# Patient Record
Sex: Male | Born: 1955 | Race: White | Hispanic: No | Marital: Married | Smoking: Former smoker
Health system: Southern US, Community
[De-identification: ages and names within clinical notes are randomized; demographics above are authoritative.]

## PROBLEM LIST (undated history)

## (undated) DIAGNOSIS — J449 Chronic obstructive pulmonary disease, unspecified: Secondary | ICD-10-CM

## (undated) DIAGNOSIS — J45909 Unspecified asthma, uncomplicated: Secondary | ICD-10-CM

## (undated) DIAGNOSIS — K219 Gastro-esophageal reflux disease without esophagitis: Secondary | ICD-10-CM

## (undated) HISTORY — DX: Chronic obstructive pulmonary disease, unspecified: J44.9

## (undated) HISTORY — DX: Unspecified asthma, uncomplicated: J45.909

## (undated) HISTORY — PX: SPINE SURGERY: SHX786

## (undated) HISTORY — DX: Gastro-esophageal reflux disease without esophagitis: K21.9

---

## 1997-04-09 HISTORY — PX: CHOLECYSTECTOMY: SHX55

## 2003-07-01 ENCOUNTER — Ambulatory Visit (HOSPITAL_COMMUNITY): Admission: RE | Admit: 2003-07-01 | Discharge: 2003-07-01 | Payer: Self-pay | Admitting: Family Medicine

## 2003-07-05 ENCOUNTER — Ambulatory Visit (HOSPITAL_COMMUNITY): Admission: RE | Admit: 2003-07-05 | Discharge: 2003-07-05 | Payer: Self-pay | Admitting: Family Medicine

## 2003-07-21 ENCOUNTER — Encounter (HOSPITAL_COMMUNITY): Admission: RE | Admit: 2003-07-21 | Discharge: 2003-08-20 | Payer: Self-pay | Admitting: Family Medicine

## 2005-07-28 ENCOUNTER — Emergency Department (HOSPITAL_COMMUNITY): Admission: EM | Admit: 2005-07-28 | Discharge: 2005-07-29 | Payer: Self-pay | Admitting: Emergency Medicine

## 2005-07-30 ENCOUNTER — Ambulatory Visit (HOSPITAL_COMMUNITY): Admission: RE | Admit: 2005-07-30 | Discharge: 2005-07-30 | Payer: Self-pay | Admitting: Family Medicine

## 2006-12-27 ENCOUNTER — Ambulatory Visit (HOSPITAL_COMMUNITY): Admission: RE | Admit: 2006-12-27 | Discharge: 2006-12-27 | Payer: Self-pay | Admitting: Family Medicine

## 2007-02-06 ENCOUNTER — Encounter (HOSPITAL_COMMUNITY): Admission: RE | Admit: 2007-02-06 | Discharge: 2007-03-08 | Payer: Self-pay | Admitting: Specialist

## 2007-03-11 ENCOUNTER — Encounter (HOSPITAL_COMMUNITY): Admission: RE | Admit: 2007-03-11 | Discharge: 2007-04-09 | Payer: Self-pay | Admitting: Specialist

## 2007-05-27 ENCOUNTER — Inpatient Hospital Stay (HOSPITAL_COMMUNITY): Admission: RE | Admit: 2007-05-27 | Discharge: 2007-05-30 | Payer: Self-pay | Admitting: Specialist

## 2007-08-12 ENCOUNTER — Encounter (HOSPITAL_COMMUNITY): Admission: RE | Admit: 2007-08-12 | Discharge: 2007-09-11 | Payer: Self-pay | Admitting: Specialist

## 2007-09-16 ENCOUNTER — Encounter (HOSPITAL_COMMUNITY): Admission: RE | Admit: 2007-09-16 | Discharge: 2007-10-16 | Payer: Self-pay | Admitting: Specialist

## 2009-04-06 ENCOUNTER — Encounter
Admission: RE | Admit: 2009-04-06 | Discharge: 2009-04-07 | Payer: Self-pay | Admitting: Physical Medicine & Rehabilitation

## 2009-04-07 ENCOUNTER — Ambulatory Visit: Payer: Self-pay | Admitting: Physical Medicine & Rehabilitation

## 2009-05-12 ENCOUNTER — Encounter
Admission: RE | Admit: 2009-05-12 | Discharge: 2009-08-10 | Payer: Self-pay | Admitting: Physical Medicine & Rehabilitation

## 2009-05-19 ENCOUNTER — Ambulatory Visit: Payer: Self-pay | Admitting: Physical Medicine & Rehabilitation

## 2009-06-16 ENCOUNTER — Ambulatory Visit: Payer: Self-pay | Admitting: Physical Medicine & Rehabilitation

## 2009-09-01 ENCOUNTER — Encounter
Admission: RE | Admit: 2009-09-01 | Discharge: 2009-09-01 | Payer: Self-pay | Admitting: Physical Medicine & Rehabilitation

## 2009-09-08 ENCOUNTER — Ambulatory Visit: Payer: Self-pay | Admitting: Physical Medicine & Rehabilitation

## 2009-11-23 ENCOUNTER — Encounter
Admission: RE | Admit: 2009-11-23 | Discharge: 2010-02-21 | Payer: Self-pay | Source: Home / Self Care | Admitting: Physical Medicine & Rehabilitation

## 2009-12-06 ENCOUNTER — Ambulatory Visit: Payer: Self-pay | Admitting: Physical Medicine & Rehabilitation

## 2010-01-16 ENCOUNTER — Ambulatory Visit: Payer: Self-pay | Admitting: Physical Medicine & Rehabilitation

## 2010-04-10 ENCOUNTER — Encounter
Admission: RE | Admit: 2010-04-10 | Discharge: 2010-04-11 | Payer: Self-pay | Source: Home / Self Care | Attending: Physical Medicine & Rehabilitation | Admitting: Physical Medicine & Rehabilitation

## 2010-04-11 ENCOUNTER — Ambulatory Visit
Admission: RE | Admit: 2010-04-11 | Discharge: 2010-04-11 | Payer: Self-pay | Source: Home / Self Care | Attending: Physical Medicine & Rehabilitation | Admitting: Physical Medicine & Rehabilitation

## 2010-07-11 ENCOUNTER — Ambulatory Visit: Payer: Self-pay | Admitting: Physical Medicine & Rehabilitation

## 2010-08-08 ENCOUNTER — Encounter: Payer: Private Health Insurance - Indemnity | Attending: Physical Medicine & Rehabilitation

## 2010-08-08 ENCOUNTER — Ambulatory Visit (HOSPITAL_BASED_OUTPATIENT_CLINIC_OR_DEPARTMENT_OTHER): Payer: Private Health Insurance - Indemnity | Admitting: Physical Medicine & Rehabilitation

## 2010-08-08 DIAGNOSIS — G5780 Other specified mononeuropathies of unspecified lower limb: Secondary | ICD-10-CM | POA: Insufficient documentation

## 2010-08-08 DIAGNOSIS — M79609 Pain in unspecified limb: Secondary | ICD-10-CM | POA: Insufficient documentation

## 2010-08-08 DIAGNOSIS — G5720 Lesion of femoral nerve, unspecified lower limb: Secondary | ICD-10-CM

## 2010-08-08 NOTE — Assessment & Plan Note (Signed)
REASON FOR VISIT:  Increased right lower extremity pain.  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  He has responded well to saphenous nerve blocks, lasting on average about 6 months.  He is about 6 months post most recent and it started wearing off about a month ago.  He also notes that he has had increased work hours.  He has had no new medical problems in the interval time.  MEDICATIONS:  Hydrocodone 5/325 one p.o. b.i.d.  He also feels like the pain is extended above his knee.  Health and history form reviewed.  The patient describes his pain as sharp, burning, stabbing, tingling, averaging 9/10, interfering with sleep.  Walking tolerance 15 minutes.  He climbs steps.  He can drive, works 40 plus hours a week, needs assist with household duties, otherwise independent.  Review of systems also positive for tingling, otherwise negative.  PHYSICAL EXAMINATION:  VITAL SIGNS:  Blood pressure 110/66, pulse 74, respirations 18, O2 sat 94% on room air. GENERAL:  No acute distress.  Mood and affect appropriate. MUSCULOSKELETAL:  Medial leg has some hypersensitivity to touch from the medial knee just above the femoral condyle to the lateral medial malleolus.  No erythema.  No swelling.  Foot is warm.  No skin discoloration.  IMPRESSION:  Saphenous neuropathy nerve block has been wearing off.  He has had some proximal extension of symptoms.  PLAN:  We will put him on a Medrol Dosepak, continue the hydrocodone given scheduled for repeat block.  We will go below the knee, given that this has been helpful and this is the main location of his pain, failing this may need to do an ultrasound-guided above the knee saphenous block behind the sartorius muscle.  Discussed with patient, agrees with plan.     [name], M.D. Electronically Signed    AEK/MedQ D:  08/08/2010 10:36:28  T:  08/08/2010 23:13:53  Job #:   [id number]

## 2010-08-22 ENCOUNTER — Ambulatory Visit (HOSPITAL_BASED_OUTPATIENT_CLINIC_OR_DEPARTMENT_OTHER): Payer: Private Health Insurance - Indemnity | Admitting: Physical Medicine & Rehabilitation

## 2010-08-22 DIAGNOSIS — G5720 Lesion of femoral nerve, unspecified lower limb: Secondary | ICD-10-CM

## 2010-08-22 NOTE — Op Note (Signed)
NAMEKAYSAN, PEIXOTO              ACCOUNT NO.:  [id number]   MEDICAL RECORD NO.:  [id number]          PATIENT TYPE:  INP   LOCATION:  5018                         FACILITY:  [facility]   PHYSICIAN:  Kerrin Champagne, M.D.   DATE OF BIRTH:  October 09, 1955   DATE OF PROCEDURE:  05/27/2007  DATE OF DISCHARGE:                               OPERATIVE REPORT   PREOPERATIVE DIAGNOSIS:  Painful degenerative disc disease, L5-S1,  minimal or mild degenerative disc disease L4-L5.   POSTOPERATIVE DIAGNOSIS:  Painful degenerative disc disease, L5-S1,  minimal or mild degenerative disc disease L4-L5.   PROCEDURE:  Left L5-S1 tranforaminal lumbar interbody fusion using a 9  mm DePuy Concord lordotic cage with local bone graft, posterolateral  fusion L5-S1 with internal fixation L5-S1 using Monarch pedicle screws  and rods, VITOSS was used over the posterolateral fusion area and a bone  marrow aspiration was carried out on the left side at the L5 pedicle and  intravertebral body of L5.   SURGEON:  Kerrin Champagne, M.D.   ASSISTANT:  Wende Neighbors, P.A.-C.   ANESTHESIA:  General via orotracheal intubation, Dr. Jean Rosenthal  Local anesthetic, 0.5% with 1:200,000 epinephrine.   FINDINGS:  Degenerative disc disease at L5-S1.   SPECIMENS:  None.   DRAINS:  Hemovac x 1 left lower lumbar, Foley catheter to straight  drain.   ESTIMATED BLOOD LOSS:  200 mL.   COMPLICATIONS:  None.   DISPOSITION:  The patient returned to the PACU in good condition.   HISTORY OF PRESENT ILLNESS:  55 year old male who has had intermittent  back discomfort for sometime.  Over the past five months, has become  progressively disabled to the point where he has stopped working.  He  has difficulty sitting for any length of time, bending, stooping, or  lifting anything over 5-10 pounds.  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  The patient has not responded to conservative  management using anti-inflammatory  agents with an exercise program  therapy.  A second opinion regarding disc replacement indicated that he  felt the patient had some very mild degenerative disc disease above L5-  S1.  L5-S1, likely the source of his discomfort and motor changes on  both endpaltes.  These findings are most consistent with mechanical back  pain.  He has been taking narcotics really continuously over the last 4-  5 months without relief of pain, inability to return to work, inability  to return to his usual activities in daily living, difficulty standing,  walking, ambulating.  This being the case, the patient is brought to the  operating room to undergo a transforaminal interbody fusion L5-S1 level  for painful degenerative disc disease L5-S1.   DESCRIPTION OF PROCEDURE:  After adequate general anesthesia, the  patient in a prone position using a Jackson spine table.  All pressure  points were well padded.  Cell saver was used during the case.  Standard  prep with DuraPrep solution, standard preoperative antibiotics of Ancef.  An incision approximately 4 1/2 to 5 inches in length through the skin  and  subcutaneous layers extending from L3 to S2 in the midline after  infiltration of Marcaine 0.5% with 1:200,000 epinephrine.  Electrocautery then used to carefully incise the lumbodorsal fascia over  both sides of the spinous process preserving the interspinous ligament  and supraspinous ligament.  Exposure obtained at the L5-S1 level over  the posterior aspect of the L4 L3 lamina bilaterally.  The sacrum  exposed to the S2 and S3 level.  Then, dissection carried out laterally.  Intraoperative C-arm fluoro with clamps on the spinous process of L5 and  L4 demonstrating these to be present and correctly identified.  A mark  made on the L5 spinous process using cautery for continued  identification.  Dissection was carried out over the sacral ala at the  L5-S1 level exposing the sacral ala and removing the facet  and capsules  of the L5-S1 level.  Bleeders controlled using electrocautery.  Dissection was carried out laterally over the lateral aspect of the L4-  L5 facet identifying the transverse process of L5 preserving the facet  capsules at L4-L5 bilaterally.  Bipolar electrocautery used to control  bleeders.  Intraoperative neural monitoring was used throughout the  case.  Back in the posterolateral region, all bleeders were well  controlled.  Facetectomy was carried out on the left side at the L5-S1  level removing the inferior articular process at L5 and resecting the  superior articular process at S1 and then using a 3 mm Kerrison to  resect the inferior 2/3 of the lamina of L5 on the left side preserving  the pars area and the attachment to the pars area continuing out to the  superior articular process of L5 which was preserved.  The ligamentum  flavum was then resected on the left side at the L5-S1 level using first  a 15 blade scalpel and pickups.  Then, 3 mm Kerrison then used to  perform a foraminotomy over the left S1 nerve root and foraminotomy at  the L5 level resecting any residual superior aspect of the superior  articular process at S1 on the left side.  This decompressed the L5  nerve root nicely.  The thecal sac and L5 nerve root were then lightly  retracted and the disc at the L5-S1 level identified.  Gelfoam thrombin  soaked packed into this area along with cottonoid.   Intraoperative C-arm fluoroscopy was then used to identify the insertion  point for the awl at the L5 level on the left side.  Note, that a Viper  retractor was placed in order to carefully expose the posterolateral  regions on both sides.  An awl was then used to make an entry point over  the lateral aspect of the pedicle at the L5 level at the intersection of  the pedicle and superior articular process of L5 transverse process just  over the inferior 1/3 on the left side.  A hand held straight pedicle   finder was then used to probe the pedicle.  I probed to 40 mm.  I  checked with a ball tip probe and appeared to be patent with the mid  portion of the pedicle penetrated and no signs of broaching the cortex.  This was then tapped with a 5.5 tap and aspiration carried out.  Note,  aspiration was carried out before tapping using the VITOSS aspiration  equipment very similar to [employer] type needle inserted through the  pedicle into the body of L5 and then aspirating bone marrow from the  body of  L5.  This was then placed over the VITOSS 10 mL strip.  The  strip then cut into 3-4 mm long tooth pick matchstick like bone  material.  The patient then had tapping of the pedicle performed, ball  tip probe then used to ensure patency, and decortication carried out of  the transverse process at L5 and the 40 mm by 6.25 screw was then placed  after first placing VITOSS down in the posterolateral region of the  decorticated transverse process at L5.  Continuing then to S1 on the  left side then the patient's superior articular process had been  resected at the very most inferior aspect and lateral to the most  inferior aspect of S1 superior articular process, an awl was used to  make an initial entry point, this was then carefully determined to be at  the adequate location on C-arm fluoro and then a hand held pedicle probe  used to probe the pedicle at S1 maintaining convergence.  About a 30 mm  depth was chosen as the pedicle finder appeared to be impinging on bone  at this depth.  A 7.0 screw was chosen, tapping with a 6.25 tap,  decorticating the alar process on the left side and then placing VITOSS  bone graft then the 30 mm by 7.0 mm screw was placed on the left side at  the S1 pedicle level observed on C-arm fluoro to be in good position and  alignment.  On the right side,  similarly, the pedicle screws were  placed, first at L5 and then at S1, beginning at L5 using an awl and the  C-arm to  identify the localization of the initial entry point into the  pedicle at L5 on the right side and using a hand held straight pedicle  finder to probe the pedicle at L5 on the right side, then checking with  a ball tip probe after using the handheld pedicle finder as well as  after tapping with a 5.5 tap, a 6.25 screw by 40 mm screw was placed on  the right side at L5.  Decortication of the transverse process and  VITOSS was placed prior to placing the screw.  Then, at S1 on the right  side, similarly an aw was used to make an entry point at the  inferolateral aspect of the superior articular process of S1 and  observed on C-arm fluoro to be in good position and alignment then a  straight pedicle finder used to probe the pedicle to 40 mm.  Convergence  was possible on the right side then left and this likely entered within  the vertebral body as the ball tip probe proved the channel to be  surrounded circumferentially with bone, no sign of broach cortex.  Tapping with a 6.25 tap and then a 40 mm by 7 mm screw was then placed  on the right side at S1 obtaining excellent capture.  Each of the  pedicle screws were then tested for soft tissue resistance using the  neural monitoring equipment.  Note that intraoperatively the left  anterior tibialis did demonstrate some very mild transient changes which  basically diminished following removal of cottonoid over the L5 nerve  root on the left.  Testing pedicle screws on the right at L5 tested out  at 31, the right S1 tested out at 26, the left L5 tested out at 36, and  the left S1 tested out at 21.  It was felt that the screws were  in  adequate position and alignment and these values represented the values  expected for these areas in the spine.  With that, then short 35 mm  lordotic rods were placed into the caps that were loosened using the cap  looser and then the caps were then placed into the fasteners that had  been loosened and lightly  tightened.  Attention was turned to the TLIF  on the left side.  Lamperts were used.  Retracting the thecal sac, the  L5-S1 level on the left side, a 15 blade scalpel used to make an entry  point into the left sided disc at L5-S1.  Disc material was then removed  using pituitary rongeurs.  The disc space dilated with 8, 9, and 10 mm  dilators.  Pituitary rongeurs were used to debride the disc material as  much as possible of degenerative disc then curettage carried out using  straight, upbiting, right, upbiting, left curettes as well as the ring  curettes straight and upbiting down to hard cortical endplate bone.  Care was taken not to broach the endplate bone as best as possible but  remove all cartilage from the endplates.  Bone graft that had been  harvested from the left side L5-S1 facet as well as additional bone  graft harvested from the right L5-S1 inferior articular process of L5  and superior articular process of S1 was then placed through a bone mill  and morselized.  The disc space at L5-S1 was then tested using the trial  8 mm and 9 mm, the 9 mm provided an excellent fit, it was felt to be a  tight fit using the Concorde trial cages, 9 mm, so 9 mm was chosen.  The  10 mm was felt to be too large and may distract the disc space  abnormally or potentially cause compression of the L5 nerve root within  the foramen with its insertion.  With this, a 9 mm lordotic Concord cage  was then filled with bone graft material harvested from the patient's  left facet at L5-S1 corticocancellous bone was used.  Additional  corticocancellous chips were then placed within the intravertebral disc  space on the left side in the posterior discotomy region.  As much bone  material as possible was placed and then an 8 mm trial cage was then  used to impact the bone graft into place.  A 23 mm length 9 mm lordotic  Concord cage was then filled, the appropriate notches and the serrated  edges of the cage  were then directed medially and the cage held at about  35 degrees of convergence.  Careful retraction of the thecal sac and L5  nerve root cage was then impacted into place across the midline in good  position and alignment, as well beneath the posterior aspect of the disc  space L5-S1 level, at least 3-4 mm.  The cage was then released from its  insertion device.  Intraoperative C-arm fluoroscopy demonstrated the  cage in excellent position and alignment.  Irrigation was then carried  out.  Thrombin soaked Gelfoam placed in the left side laminotomy region  and foraminotomy region L5-S1.  The torque wrench was used for torquing  the patient's caps on the left side was then used to tighten the cap at  L5 on the level of the left side using anti-torque handle to prevent  torsion in the opposite direction with the torquing of the cap at the L5  level.  Care was  taken to ensure that enough rod was passed through the  fastener to allow for a good grip here and good seating of the  instrumentation.  This completed, then compression was obtained between  the fasteners on the left side at L5 and S1 and the S1 fastener cap was  tightened at 80 foot pounds.  Turning to the right side, the patient's  fastener cap was then tightened at 80 foot pounds using the torque  device as well as torque screw driver and ensuring again that an  adequate rod was protruding through the end of the fastener to allow for  good purchase.  Once this was complete, compression was obtained between  L5 and S1 fasteners and the fastener cap at the S1 level was then  tightened to 80 foot pounds.  Following this, irrigation was further  carried out.  Careful inspection of the patient's laminotomy region left  side foramen demonstrated no further bone material within the spinal  canal or within the foramen over the S1 foramen.  The S1 and L5 nerve  roots appeared without nerve compression.  The disc appeared to be flat  and  demonstrated no significant protrusion posteriorly.  With that, a  small amount of thrombin soaked Gelfoam was then placed over the  posterior laminotomy region on the left side.  Irrigation was carried  out.  Any additional bone graft or VITOSS was then placed in the  posterolateral regions of both the left and right side at L5-S1.  Intraoperative C-arm fluoroscopy, AP and lateral views obtained, demonstrated documenting the internal fixation of L5-S1 in good position  and alignment.  A medium Hemovac drain was then placed in the depth of  the incision, the Viper retractor removed, and the paralumbar muscles  carefully debrided of any devitalized tissue.  The lumbodorsal fascia  was reapproximated in the midline with interrupted figure-of-eight  simple sutures of #1 Vicryl attaching the lumbodorsal fascia to the  interspinous processes and spinous processes in so doing.  The deep  subcu layers were reapproximated with interrupted 0 and 2-0 Vicryl  sutures and then a running subcu stitch of 4-0 Vicryl.  The patient then  had Dermabond applied.  4 by 4s and ABD pad affixed to the skin with  Hypafix tape.  The patient had hepatitis C identified on his  preoperative testing and preoperative autogenous blood given  intraoperatively.  The patient had no complications.  There was no  penetration of skin personnel operating or any significant abnormalities  here.  Following closure, the patient was then returned to the supine  position.  Note, a Foley catheter was placed at the beginning of the  case and this remained in place.  The patient was then reactivated,  extubated, and returned to the recovery room in satisfactory condition.  All instrument and sponge counts were correct.  Note, the patient also,  at the end of the case, was given 30 mg Toradol to diminish discomfort.      Kerrin Champagne, M.D.  Electronically Signed     JEN/MEDQ  D:  05/27/2007  T:  05/27/2007  Job:  [id number]

## 2010-08-22 NOTE — Procedures (Signed)
NAMEHENSLEY, TREAT              ACCOUNT NO.:  [id number]  MEDICAL RECORD NO.:  [id number]           PATIENT TYPE:  O  LOCATION:  TPC                          FACILITY:  [facility]  PHYSICIAN:  [name], M.D.DATE OF BIRTH:  Mar 28, 1956  DATE OF PROCEDURE: DATE OF DISCHARGE:                              OPERATIVE REPORT  PROCEDURE:  Right saphenous nerve block.  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  Last injection was performed in October 2011.  Informed consent was obtained after describing the risks and benefits of the procedure.  These include bleeding, bruising, infection.  He elects to proceed and has given written consent.  EMG stimulator utilized, a right medial tibial flare. Paresthesias reproduced to the ankle area, area was marked, prepped with chlorhexidine, entered with 22-gauge 40-mm needle electrode under E-stem guidance.  Medial paresthesias reproduced, 1 mL of 40 mg/mL Depo-Medrol and 4 mL 1% lidocaine.  Ultrasound scan with needle in.  Identify the needle with surrounding fluid with injection.  Images saved.     [name], M.D. Electronically Signed    AEK/MEDQ  D:  08/22/2010 10:37:27  T:  08/22/2010 23:16:41  Job:  [id number]

## 2010-08-25 NOTE — Discharge Summary (Signed)
Christopher Gould, Christopher Gould              ACCOUNT NO.:  [id number]   MEDICAL RECORD NO.:  [id number]          PATIENT TYPE:  INP   LOCATION:  5018                         FACILITY:  [facility]   PHYSICIAN:  Kerrin Champagne, M.D.   DATE OF BIRTH:  1955-11-09   DATE OF ADMISSION:  05/27/2007  DATE OF DISCHARGE:  05/30/2007                               DISCHARGE SUMMARY   ADMISSION DIAGNOSES:  1. Painful degenerative disk disease, L5-S1 minimal to mild      degenerative disk disease, L4-L5.  2. Hepatitis C.  3. Gastroesophageal reflux disease.  4. History of benign prostatic hypertrophy.  5. Emphysema.   DISCHARGE DIAGNOSES:  1. Painful degenerative disk disease L5-S1 minimal to mild      degenerative disk disease L4-L5.  2. Hepatitis C.  3. Gastroesophageal reflux disease.  4. History of benign prostatic hypertrophy.  5. Emphysema.  6. 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   PROCEDURE:  On May 27, 2007, the patient underwent left L5-S1  transforaminal lumbar interbody fusion with posterolateral fusion, L5-S1  utilizing internal fixation with pedicle screws and rods and VITOSS with  bone marrow aspiration from the L5 pedicle and intravertebral body of  L5.  This was performed by Dr. Otelia Sergeant and assisted by Maud Deed, PA-C  under general anesthesia.   CONSULTATIONS:  None.   BRIEF HISTORY:  The patient is a 55 year old male with several months of  progressively worsening back pain.  He has had to stop working due to  his difficulty with bending, stooping, or lifting anything over 5  pounds.  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  He has not responded to conservative  managements including anti-inflammatory agents and exercise program.  He  has undergone studies including MRI that indicate disk desiccation at L4-  L5 and L5-S1.  Mild to moderate degenerative disk disease at L5-S1,  which is considered to  be most painful to the patient.  He did undergo  second opinion regarding disk replacement; however, the patient was not  felt to be suitable candidate for this.  It was felt that he would  benefit from surgical intervention in the form of the above stated  procedure and was admitted for this.   BRIEF HOSPITAL COURSE:  The patient tolerated the procedure without  complications.  Postoperatively, neurovascular motor function of the  lower extremities was noted to be intact.  He was treated initially with  PCA analgesics and gradually weaned to p.o. analgesics.  He did require  use of OxyContin 20 mg q.12 hours for adequate pain control as well as  Robaxin for muscle spasms and Percocet for breakthrough pain.  After his  Foley catheter was discontinued, he had difficulty voiding and did  require I&O cath x1.  After that, he was able to void independently.  Diet was held until bowel sounds and flatus were positive.  Eventually,  he was able to go back on a regular diet without difficulty.  Dressing  change was done daily after drain removed on the first postoperative  day.  His incision was found to be healing well without erythema, edema,  or drainage.  Physical therapy was initiated for ambulation and gait  training.  The patient utilized an Therapist, nutritional and a walker.  He was able  to progress fairly steadily with physical therapy during the hospital  stay and was able to ambulate independently prior to discharge.  He was  seen by occupational therapy for ADLs and was independent with ADLs at  the time of discharge.   PERTINENT LABORATORY VALUES:  Hemoglobin and hematocrit dropped to the  lowest value of 9.4 and 26.8 postoperatively.  He did not require blood  transfusion and was treated with iron supplementation.  Coagulation  studies on admission were within normal limits.  Routine chemistry  studies were normal on admission as well and repeat postoperatively also  showed values normal  with exception of BUN 5 and calcium 8.3.  Urinalysis on admission was negative for urinary tract infection.  EKG  on admission, normal sinus rhythm with no previous EKGs for comparison.   PLAN:  Arrangements were made for the patient to be discharged to his  home.  Home health physical therapy and durable medical equipment were  made available to the patient.  He was instructed to keep his dressing  changed on a daily basis.  He was given supplies to do so and was  allowed to shower with a Tegaderm dressing over the incision.  He was  encouraged in coughing and deep breathing.  He will resume a regular  diet at home.  He was instructed to continue walking utilizing a walker.  He is also to wear his brace full-time at home.   MEDICATIONS:  1. OxyContin 20 mg, #60, 1 every 12 hours.  2. Percocet 5/325, #60, 1-2 every 4-6 hours as needed for breakthrough      pain.  3. Robaxin 500 mg 1 every 8 hours as needed for spasm and multivitamin      with iron daily.  He is also instructed to use over-the-counter      stool softener or laxative on a daily basis as needed for      constipation.   FOLLOWUP:  He will follow up with Dr. Otelia Sergeant in 2 weeks from the date of  the surgery.  Should he have questions or concerns prior to his return  to office visit, he was advised to call the office.  All questions  encouraged and answered prior to discharge.      Wende Neighbors, P.A.      Kerrin Champagne, M.D.  Electronically Signed    SMV/MEDQ  D:  07/04/2007  T:  07/05/2007  Job:  [id number]

## 2010-10-24 ENCOUNTER — Ambulatory Visit: Payer: Private Health Insurance - Indemnity | Admitting: Physical Medicine & Rehabilitation

## 2010-10-24 ENCOUNTER — Encounter: Payer: Private Health Insurance - Indemnity | Attending: Neurosurgery | Admitting: Neurosurgery

## 2010-10-24 DIAGNOSIS — G5780 Other specified mononeuropathies of unspecified lower limb: Secondary | ICD-10-CM | POA: Insufficient documentation

## 2010-10-24 DIAGNOSIS — M25569 Pain in unspecified knee: Secondary | ICD-10-CM

## 2010-10-25 NOTE — Assessment & Plan Note (Signed)
Patient of Dr. 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 Otelia Sergeant.  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 Wynn Banker in the past have helped.  He states this time, his pain level is about 3-6, it varies from a sharp to stabbing pain.  General activity level is a 1-4.  Pain is the same 24 hours a day.  Sleep patterns are fair.  Walking, sitting, standing tend to aggravate heat, medication tend to help.  He walks without assistance and walk about 20 minutes.  He does drive and climb steps without problems.  He is employed.  He works 40 hours a week.  REVIEW OF SYSTEMS:  Notable for difficulties as described above, otherwise within normal limits.  PAST MEDICAL HISTORY AND SOCIAL HISTORY:  Unchanged.  FAMILY HISTORY:  Unchanged.  PHYSICAL EXAMINATION:  VITAL SIGNS:  His blood pressure is 119/81, his pulse 86, respirations 16, O2 sats 93 on room air. MUSCULOSKELETAL:  His motor strength is 5/5 in lower extremities.  He does have some diminished sensation in the right lower extremity.  He is constitutionally, bandaged within normal limits, otherwise oriented x3. He has got a normal gait.  IMPRESSION:  Saphenous neuropathy, right leg.  PLAN:  Refill Norco 5/325 one p.o. b.i.d. 60 with no refill.  He will follow up here in the clinic as scheduled.  His questions were encouraged and answered.     Christopher Gould L. Blima Dessert Electronically Signed    RLW/MedQ D:  10/24/2010 13:19:37  T:  10/25/2010 02:27:12  Job #:  [id number]

## 2010-12-29 LAB — COMPREHENSIVE METABOLIC PANEL
AST: 17
Albumin: 4.3
Calcium: 9.8
Chloride: 105
Creatinine, Ser: 0.88
GFR calc Af Amer: >60
Sodium: 138
Total Bilirubin: 1.2

## 2010-12-29 LAB — HEMOGLOBIN AND HEMATOCRIT, BLOOD: HCT: 26.8 — ABNORMAL LOW

## 2010-12-29 LAB — CBC
MCV: 93.2
Platelets: 275
WBC: 9.9

## 2010-12-29 LAB — URINALYSIS, ROUTINE W REFLEX MICROSCOPIC
Ketones, ur: NEGATIVE
Nitrite: NEGATIVE
Protein, ur: NEGATIVE

## 2010-12-29 LAB — DIFFERENTIAL
Lymphocytes Relative: 19
Monocytes Absolute: 0.6
Monocytes Relative: 6
Neutro Abs: 7

## 2010-12-29 LAB — TYPE AND SCREEN: ABO/RH(D): O POS

## 2010-12-29 LAB — BASIC METABOLIC PANEL
CO2: 25
CO2: 26
Calcium: 8.3 — ABNORMAL LOW
Chloride: 106
Creatinine, Ser: 0.95
GFR calc Af Amer: >60
Glucose, Bld: 112 — ABNORMAL HIGH
Sodium: 138

## 2010-12-29 LAB — ABO/RH: ABO/RH(D): O POS

## 2010-12-29 LAB — APTT: aPTT: 31

## 2011-01-23 ENCOUNTER — Ambulatory Visit: Payer: Private Health Insurance - Indemnity | Admitting: Physical Medicine & Rehabilitation

## 2011-02-05 ENCOUNTER — Encounter: Payer: Private Health Insurance - Indemnity | Attending: Physical Medicine & Rehabilitation

## 2011-02-05 ENCOUNTER — Ambulatory Visit (HOSPITAL_BASED_OUTPATIENT_CLINIC_OR_DEPARTMENT_OTHER): Payer: Private Health Insurance - Indemnity | Admitting: Physical Medicine & Rehabilitation

## 2011-02-05 DIAGNOSIS — G5790 Unspecified mononeuropathy of unspecified lower limb: Secondary | ICD-10-CM | POA: Insufficient documentation

## 2011-02-05 DIAGNOSIS — G5720 Lesion of femoral nerve, unspecified lower limb: Secondary | ICD-10-CM

## 2011-02-05 NOTE — Assessment & Plan Note (Signed)
HISTORY:  Christopher Gould returns today, he feels like his injection i.e. saphenous nerve block is wearing off, it has been about 5 months post. He continues to use hydrocodone for pain and Norco 5/325 that he takes p.o. b.i.d.  He continues to work full time.  He states he has been busy at work and expects to be so throughout the rest of the year.  EXAMINATION:  He has hypersensitivity right medial leg with light touch. No skin discoloration.  Full range of motion actively at the ankle and foot.  IMPRESSION: 1. No evidence of dystrophic changes in nails. 2. Saphenous neuritis.  No evidence of a full blown RSD.  PLAN:  We will do repeat saphenous nerve block as per patient request early January and we will continue hydrocodone until that time.     [name], M.D. Electronically Signed    AEK/MedQ D:  02/05/2011 11:00:40  T:  02/05/2011 11:33:09  Job #:  [id number]

## 2011-04-12 ENCOUNTER — Ambulatory Visit: Payer: Private Health Insurance - Indemnity | Admitting: Physical Medicine & Rehabilitation

## 2011-05-03 ENCOUNTER — Encounter: Payer: Private Health Insurance - Indemnity | Attending: Physical Medicine & Rehabilitation

## 2011-05-03 ENCOUNTER — Ambulatory Visit (HOSPITAL_BASED_OUTPATIENT_CLINIC_OR_DEPARTMENT_OTHER): Payer: Private Health Insurance - Indemnity | Admitting: Physical Medicine & Rehabilitation

## 2011-05-03 DIAGNOSIS — G5790 Unspecified mononeuropathy of unspecified lower limb: Secondary | ICD-10-CM | POA: Insufficient documentation

## 2011-05-03 DIAGNOSIS — G5720 Lesion of femoral nerve, unspecified lower limb: Secondary | ICD-10-CM

## 2011-06-06 ENCOUNTER — Other Ambulatory Visit: Payer: Self-pay | Admitting: *Deleted

## 2011-06-06 MED ORDER — HYDROCODONE-ACETAMINOPHEN 5-325 MG PO TABS: 1.0000 | ORAL_TABLET | Freq: Two times a day (BID) | ORAL | Status: DC | PRN

## 2011-07-10 ENCOUNTER — Other Ambulatory Visit: Payer: Self-pay | Admitting: *Deleted

## 2011-07-10 MED ORDER — HYDROCODONE-ACETAMINOPHEN 5-325 MG PO TABS: 1.0000 | ORAL_TABLET | Freq: Two times a day (BID) | ORAL | Status: DC | PRN

## 2011-08-14 ENCOUNTER — Other Ambulatory Visit: Payer: Self-pay | Admitting: *Deleted

## 2011-08-14 MED ORDER — HYDROCODONE-ACETAMINOPHEN 5-325 MG PO TABS: 1.0000 | ORAL_TABLET | Freq: Two times a day (BID) | ORAL | Status: DC | PRN

## 2011-09-17 ENCOUNTER — Other Ambulatory Visit: Payer: Self-pay | Admitting: *Deleted

## 2011-09-17 MED ORDER — HYDROCODONE-ACETAMINOPHEN 5-325 MG PO TABS: 1.0000 | ORAL_TABLET | Freq: Two times a day (BID) | ORAL | Status: DC | PRN

## 2011-10-22 ENCOUNTER — Ambulatory Visit: Payer: Private Health Insurance - Indemnity | Admitting: Physical Medicine & Rehabilitation

## 2011-10-22 ENCOUNTER — Other Ambulatory Visit: Payer: Self-pay | Admitting: Physical Medicine & Rehabilitation

## 2011-10-29 ENCOUNTER — Encounter: Payer: Private Health Insurance - Indemnity | Attending: Physical Medicine & Rehabilitation

## 2011-10-29 ENCOUNTER — Ambulatory Visit (HOSPITAL_BASED_OUTPATIENT_CLINIC_OR_DEPARTMENT_OTHER): Payer: Private Health Insurance - Indemnity | Admitting: Physical Medicine & Rehabilitation

## 2011-10-29 ENCOUNTER — Encounter: Payer: Self-pay | Admitting: Physical Medicine & Rehabilitation

## 2011-10-29 VITALS — BP 115/82 | HR 75 | Resp 16 | Ht 66.0 in | Wt 163.8 lb

## 2011-10-29 DIAGNOSIS — G5780 Other specified mononeuropathies of unspecified lower limb: Secondary | ICD-10-CM | POA: Insufficient documentation

## 2011-10-29 NOTE — Progress Notes (Signed)
PROCEDURE: Right saphenous nerve block.  INDICATION: Saphenous neuritis with pain only partially response to  medication management including narcotic analgesics. Last injection was  performed in October 2011. Informed consent was obtained after  describing the risks and benefits of the procedure. These include  bleeding, bruising, infection. He elects to proceed and has given  written consent. EMG stimulator utilized, a right medial tibial flare.  Paresthesias reproduced to the ankle area, area was marked, prepped with  chlorhexidine, entered with 22-gauge 40-mm needle electrode under E-stem  guidance. Medial paresthesias reproduced, 1 mL of 40 mg/mL Depo-Medrol  and 4 mL .25% marcaine.  

## 2011-10-29 NOTE — Patient Instructions (Signed)
If symptoms return early can call to schedule injection earlier

## 2011-11-26 ENCOUNTER — Other Ambulatory Visit: Payer: Self-pay | Admitting: *Deleted

## 2011-11-26 MED ORDER — HYDROCODONE-ACETAMINOPHEN 5-325 MG PO TABS: 1.0000 | ORAL_TABLET | Freq: Two times a day (BID) | ORAL | Status: DC

## 2012-01-01 ENCOUNTER — Other Ambulatory Visit: Payer: Self-pay | Admitting: *Deleted

## 2012-01-01 MED ORDER — HYDROCODONE-ACETAMINOPHEN 5-325 MG PO TABS: 1.0000 | ORAL_TABLET | Freq: Two times a day (BID) | ORAL | Status: DC

## 2012-01-28 ENCOUNTER — Encounter: Payer: Self-pay | Admitting: Physical Medicine & Rehabilitation

## 2012-01-28 ENCOUNTER — Encounter: Payer: Private Health Insurance - Indemnity | Attending: Physical Medicine & Rehabilitation

## 2012-01-28 ENCOUNTER — Ambulatory Visit (HOSPITAL_BASED_OUTPATIENT_CLINIC_OR_DEPARTMENT_OTHER): Payer: Private Health Insurance - Indemnity | Admitting: Physical Medicine & Rehabilitation

## 2012-01-28 VITALS — BP 131/86 | HR 88 | Resp 14 | Ht 66.0 in | Wt 165.0 lb

## 2012-01-28 DIAGNOSIS — G5771 Causalgia of right lower limb: Secondary | ICD-10-CM

## 2012-01-28 DIAGNOSIS — G5780 Other specified mononeuropathies of unspecified lower limb: Secondary | ICD-10-CM | POA: Insufficient documentation

## 2012-01-28 DIAGNOSIS — G5770 Causalgia of unspecified lower limb: Secondary | ICD-10-CM

## 2012-01-28 DIAGNOSIS — Z5181 Encounter for therapeutic drug level monitoring: Secondary | ICD-10-CM

## 2012-01-28 DIAGNOSIS — G589 Mononeuropathy, unspecified: Secondary | ICD-10-CM

## 2012-01-28 DIAGNOSIS — G629 Polyneuropathy, unspecified: Secondary | ICD-10-CM

## 2012-01-28 MED ORDER — HYDROCODONE-ACETAMINOPHEN 5-325 MG PO TABS: 1.0000 | ORAL_TABLET | Freq: Two times a day (BID) | ORAL | Status: DC

## 2012-01-28 NOTE — Progress Notes (Signed)
  Subjective:    Patient ID: Christopher Gould, male    DOB: 04-May-1955, 56 y.o.   MRN: [id number]  HPI History of right saphenous neuralgia. Last nerve block was 10/29/2011. Increasing activity at work due to change in job description. Pain Inventory Average Pain 8 Pain Right Now 5 My pain is sharp, burning, stabbing and aching  In the last 24 hours, has pain interfered with the following? General activity 4 Relation with others 1 Enjoyment of life 1 What TIME of day is your pain at its worst? all the time Sleep (in general) Fair  Pain is worse with: walking, bending, sitting, inactivity and standing Pain improves with: heat/ice, medication and injections Relief from Meds: 9  Mobility walk without assistance how many minutes can you walk? 20-25 ability to climb steps?  yes do you drive?  yes Do you have any goals in this area?  yes  Function employed # of hrs/week [employer] Do you have any goals in this area?  yes  Neuro/Psych No problems in this area  Prior Studies Any changes since last visit?  no  Physicians involved in your care Any changes since last visit?  no   Family History  Problem Relation Age of Onset  . Heart disease Mother    History   Social History  . Marital Status: Married    Spouse Name: N/A    Number of Children: N/A  . Years of Education: N/A   Social History Main Topics  . Smoking status: Former Smoker    Quit date: 04/06/2011  . Smokeless tobacco: Former Neurosurgeon    Quit date: 10/29/1991  . Alcohol Use: None  . Drug Use: None  . Sexually Active: None   Other Topics Concern  . None   Social History Narrative  . None   Past Surgical History  Procedure Date  . Spine surgery   . Cholecystectomy    History reviewed. No pertinent past medical history. BP 131/86  Pulse 88  Resp 14  Ht 5\' 6"  (1.676 m)  Wt 165 lb (74.844 kg)  BMI 26.63 kg/m2  SpO2 97%     Review of Systems  Musculoskeletal: Positive for myalgias  and arthralgias.  All other systems reviewed and are negative.       Objective:   Physical Exam Numbness to light touch in the right medial calf and medial malleolus area. Normal strength in the right lower extremity Gait is normal Mood and affect are appropriate Vitals reviewed Nursing notes reviewed       Assessment & Plan:  1. Causalgia right saphenous nerve distribution will repeat nerve block. Continue Vicodin 5/325 twice a day when necessary

## 2012-02-08 ENCOUNTER — Telehealth: Payer: Self-pay | Admitting: Physical Medicine & Rehabilitation

## 2012-02-08 MED ORDER — HYDROCODONE-ACETAMINOPHEN 5-325 MG PO TABS: 1.0000 | ORAL_TABLET | Freq: Two times a day (BID) | ORAL | Status: DC

## 2012-02-08 NOTE — Telephone Encounter (Signed)
PHARMACY HAS NOT RECEIVED REFILL REQUEST

## 2012-02-08 NOTE — Telephone Encounter (Signed)
Refilled and notified mr Noyes

## 2012-02-28 ENCOUNTER — Ambulatory Visit: Payer: Private Health Insurance - Indemnity | Admitting: Physical Medicine & Rehabilitation

## 2012-03-12 ENCOUNTER — Other Ambulatory Visit: Payer: Self-pay | Admitting: *Deleted

## 2012-03-12 MED ORDER — HYDROCODONE-ACETAMINOPHEN 5-325 MG PO TABS: 1.0000 | ORAL_TABLET | Freq: Two times a day (BID) | ORAL | Status: DC

## 2012-03-20 ENCOUNTER — Ambulatory Visit: Payer: Private Health Insurance - Indemnity | Admitting: Physical Medicine & Rehabilitation

## 2012-03-20 ENCOUNTER — Ambulatory Visit (HOSPITAL_BASED_OUTPATIENT_CLINIC_OR_DEPARTMENT_OTHER): Payer: Private Health Insurance - Indemnity | Admitting: Physical Medicine & Rehabilitation

## 2012-03-20 ENCOUNTER — Encounter: Payer: Private Health Insurance - Indemnity | Attending: Physical Medicine & Rehabilitation

## 2012-03-20 ENCOUNTER — Encounter: Payer: Self-pay | Admitting: Physical Medicine & Rehabilitation

## 2012-03-20 VITALS — BP 123/80 | HR 68 | Resp 14 | Ht 66.0 in | Wt 169.0 lb

## 2012-03-20 DIAGNOSIS — G5770 Causalgia of unspecified lower limb: Secondary | ICD-10-CM

## 2012-03-20 DIAGNOSIS — G5780 Other specified mononeuropathies of unspecified lower limb: Secondary | ICD-10-CM | POA: Insufficient documentation

## 2012-03-20 DIAGNOSIS — G5771 Causalgia of right lower limb: Secondary | ICD-10-CM

## 2012-03-20 NOTE — Patient Instructions (Signed)
We may repeat this injection as soon as 3 months if needed See you back in 3 months

## 2012-03-20 NOTE — Progress Notes (Signed)
PROCEDURE: Right saphenous nerve block.  INDICATION: Saphenous neuritis with pain only partially response to  medication management including narcotic analgesics. Last injection was  performed in October 2011. Informed consent was obtained after  describing the risks and benefits of the procedure. These include  bleeding, bruising, infection. He elects to proceed and has given  written consent. EMG stimulator utilized, a right medial tibial flare.  Paresthesias reproduced to the ankle area, area was marked, prepped with  chlorhexidine, entered with 22-gauge 40-mm needle electrode under E-stem  guidance. Medial paresthesias reproduced, 1 mL of 40 mg/mL Depo-Medrol  and 4 mL .25% marcaine.  

## 2012-04-14 ENCOUNTER — Other Ambulatory Visit: Payer: Self-pay

## 2012-04-14 MED ORDER — HYDROCODONE-ACETAMINOPHEN 5-325 MG PO TABS: 1.0000 | ORAL_TABLET | Freq: Two times a day (BID) | ORAL | Status: DC

## 2012-05-19 ENCOUNTER — Telehealth: Payer: Self-pay

## 2012-05-19 MED ORDER — HYDROCODONE-ACETAMINOPHEN 5-325 MG PO TABS: 1.0000 | ORAL_TABLET | Freq: Two times a day (BID) | ORAL | Status: DC

## 2012-05-19 NOTE — Telephone Encounter (Signed)
Patient called for refill.  Did not say what medication but to call it into [facility].

## 2012-05-19 NOTE — Telephone Encounter (Signed)
Refilled

## 2012-06-03 ENCOUNTER — Ambulatory Visit (HOSPITAL_COMMUNITY)
Admission: RE | Admit: 2012-06-03 | Discharge: 2012-06-03 | Disposition: A | Discharge status: Home / Self Care | Payer: Private Health Insurance - Indemnity | Source: Ambulatory Visit | Attending: Family Medicine | Admitting: Family Medicine

## 2012-06-03 ENCOUNTER — Other Ambulatory Visit (HOSPITAL_COMMUNITY): Payer: Self-pay | Admitting: Family Medicine

## 2012-06-03 DIAGNOSIS — R059 Cough, unspecified: Secondary | ICD-10-CM | POA: Insufficient documentation

## 2012-06-03 DIAGNOSIS — R05 Cough: Secondary | ICD-10-CM

## 2012-06-03 DIAGNOSIS — R0602 Shortness of breath: Secondary | ICD-10-CM | POA: Insufficient documentation

## 2012-06-09 ENCOUNTER — Encounter: Payer: Self-pay | Admitting: *Deleted

## 2012-06-12 ENCOUNTER — Ambulatory Visit: Payer: Private Health Insurance - Indemnity | Admitting: Cardiovascular Disease

## 2012-06-16 ENCOUNTER — Encounter: Payer: 59 | Attending: Physical Medicine & Rehabilitation

## 2012-06-16 ENCOUNTER — Ambulatory Visit: Payer: Private Health Insurance - Indemnity | Admitting: Physical Medicine & Rehabilitation

## 2012-06-16 DIAGNOSIS — G5780 Other specified mononeuropathies of unspecified lower limb: Secondary | ICD-10-CM | POA: Insufficient documentation

## 2012-06-23 ENCOUNTER — Other Ambulatory Visit: Payer: Self-pay

## 2012-06-23 MED ORDER — HYDROCODONE-ACETAMINOPHEN 5-325 MG PO TABS: 1.0000 | ORAL_TABLET | Freq: Two times a day (BID) | ORAL | Status: DC

## 2012-07-21 ENCOUNTER — Encounter (INDEPENDENT_AMBULATORY_CARE_PROVIDER_SITE_OTHER): Payer: Self-pay | Admitting: *Deleted

## 2012-07-24 ENCOUNTER — Encounter (INDEPENDENT_AMBULATORY_CARE_PROVIDER_SITE_OTHER): Payer: Self-pay | Admitting: *Deleted

## 2012-07-24 ENCOUNTER — Other Ambulatory Visit (INDEPENDENT_AMBULATORY_CARE_PROVIDER_SITE_OTHER): Payer: Self-pay | Admitting: *Deleted

## 2012-07-24 ENCOUNTER — Telehealth (INDEPENDENT_AMBULATORY_CARE_PROVIDER_SITE_OTHER): Payer: Self-pay | Admitting: *Deleted

## 2012-07-24 DIAGNOSIS — Z1211 Encounter for screening for malignant neoplasm of colon: Secondary | ICD-10-CM

## 2012-07-24 MED ORDER — PEG-KCL-NACL-NASULF-NA ASC-C 100 G PO SOLR: 1.0000 | Freq: Once | ORAL | Status: DC

## 2012-07-24 NOTE — Telephone Encounter (Signed)
Patient needs movi prep 

## 2012-08-04 ENCOUNTER — Other Ambulatory Visit: Payer: Self-pay

## 2012-08-04 MED ORDER — HYDROCODONE-ACETAMINOPHEN 5-325 MG PO TABS: 1.0000 | ORAL_TABLET | Freq: Two times a day (BID) | ORAL | Status: DC

## 2012-08-06 ENCOUNTER — Telehealth (INDEPENDENT_AMBULATORY_CARE_PROVIDER_SITE_OTHER): Payer: Self-pay | Admitting: *Deleted

## 2012-08-06 NOTE — Telephone Encounter (Signed)
  Procedure: tcs  Reason/Indication:  screening  Has patient had this procedure before?  no  If so, when, by whom and where?    Is there a family history of colon cancer?  no  Who?  What age when diagnosed?    Is patient diabetic?   no      Does patient have prosthetic heart valve?  no  Do you have a pacemaker?  no  Has patient ever had endocarditis? no  Has patient had joint replacement within last 12 months?  no  Is patient on Coumadin, Plavix and/or Aspirin? n  Medications: hydrocodone bid  Allergies: nkda  Medication Adjustment:   Procedure date & time: 09/03/12 at 1030

## 2012-08-07 NOTE — Telephone Encounter (Signed)
agree

## 2012-08-14 ENCOUNTER — Encounter (HOSPITAL_COMMUNITY): Payer: Self-pay | Admitting: Pharmacy Technician

## 2012-08-19 ENCOUNTER — Encounter: Payer: Self-pay | Admitting: Physical Medicine & Rehabilitation

## 2012-08-19 ENCOUNTER — Encounter: Payer: Managed Care, Other (non HMO) | Attending: Physical Medicine & Rehabilitation

## 2012-08-19 ENCOUNTER — Ambulatory Visit (HOSPITAL_BASED_OUTPATIENT_CLINIC_OR_DEPARTMENT_OTHER): Payer: Private Health Insurance - Indemnity | Admitting: Physical Medicine & Rehabilitation

## 2012-08-19 VITALS — BP 121/82 | HR 69 | Resp 16 | Ht 66.0 in | Wt 175.0 lb

## 2012-08-19 DIAGNOSIS — Z7902 Long term (current) use of antithrombotics/antiplatelets: Secondary | ICD-10-CM

## 2012-08-19 DIAGNOSIS — G5780 Other specified mononeuropathies of unspecified lower limb: Secondary | ICD-10-CM

## 2012-08-19 DIAGNOSIS — Z5181 Encounter for therapeutic drug level monitoring: Secondary | ICD-10-CM

## 2012-08-19 DIAGNOSIS — G5781 Other specified mononeuropathies of right lower limb: Secondary | ICD-10-CM

## 2012-08-19 NOTE — Addendum Note (Signed)
Addended by: Doreene Eland on: 08/19/2012 10:41 AM   Modules accepted: Orders

## 2012-08-19 NOTE — Progress Notes (Signed)
  Subjective:    Patient ID: Christopher Gould, male    DOB: February 11, 1956, 57 y.o.   MRN: [id number]  HPI Having recurrence of medial leg and medial ankle pain. Sometimes pain occurs around the knee area. When this occurs it severe. No recent knee injury. Last saphenous nerve block was on 03/20/2012. Pain Inventory Average Pain 7 Pain Right Now 9 My pain is sharp, burning, stabbing and tingling  In the last 24 hours, has pain interfered with the following? General activity 4 Relation with others 0 Enjoyment of life 8 What TIME of day is your pain at its worst? constant Sleep (in general) Fair  Pain is worse with: walking, bending, sitting, inactivity, standing and some activites Pain improves with: heat/ice and medication Relief from Meds: 9  Mobility how many minutes can you walk? 20 ability to climb steps?  yes do you drive?  yes Do you have any goals in this area?  yes  Function employed # of hrs/week 48 Do you have any goals in this area?  yes  Neuro/Psych numbness  Prior Studies Any changes since last visit?  no  Physicians involved in your care Any changes since last visit?  no   Family History  Problem Relation Age of Onset  . Heart disease Mother    History   Social History  . Marital Status: Married    Spouse Name: N/A    Number of Children: N/A  . Years of Education: N/A   Social History Main Topics  . Smoking status: Former Smoker    Quit date: 04/06/2011  . Smokeless tobacco: Former Neurosurgeon    Quit date: 10/29/1991  . Alcohol Use: None  . Drug Use: None  . Sexually Active: None   Other Topics Concern  . None   Social History Narrative  . None   Past Surgical History  Procedure Laterality Date  . Spine surgery    . Cholecystectomy     Past Medical History  Diagnosis Date  . COPD (chronic obstructive pulmonary disease)   . GERD (gastroesophageal reflux disease)   . Asthma    BP 121/82  Pulse 69  Resp 16  Ht 5\' 6"  (1.676 m)  Wt 175  lb (79.379 kg)  BMI 28.26 kg/m2  SpO2 97%     Review of Systems  Neurological: Positive for numbness.  All other systems reviewed and are negative.       Objective:   Physical Exam  Right knee without evidence of swelling no evidence of crepitus no pain to palpation along the joint line or along the patellar tendon or quadriceps tendon. No pain along the hamstring. Some dysesthesia along the saphenous nerve distribution.      Assessment & Plan:  1. Saphenous neuritis  after lumbar surgery We'll repeat saphenous nerve block in July when he is on his new insurance plan.

## 2012-09-03 ENCOUNTER — Encounter (HOSPITAL_COMMUNITY)
Admission: RE | Disposition: A | Discharge status: Home / Self Care | Payer: Self-pay | Source: Ambulatory Visit | Attending: Internal Medicine

## 2012-09-03 ENCOUNTER — Ambulatory Visit (HOSPITAL_COMMUNITY)
Admission: RE | Admit: 2012-09-03 | Discharge: 2012-09-03 | Disposition: A | Discharge status: Home / Self Care | Payer: Managed Care, Other (non HMO) | Source: Ambulatory Visit | Attending: Internal Medicine | Admitting: Internal Medicine

## 2012-09-03 ENCOUNTER — Encounter (HOSPITAL_COMMUNITY): Payer: Self-pay

## 2012-09-03 DIAGNOSIS — J449 Chronic obstructive pulmonary disease, unspecified: Secondary | ICD-10-CM | POA: Insufficient documentation

## 2012-09-03 DIAGNOSIS — Z1211 Encounter for screening for malignant neoplasm of colon: Secondary | ICD-10-CM

## 2012-09-03 DIAGNOSIS — D126 Benign neoplasm of colon, unspecified: Secondary | ICD-10-CM

## 2012-09-03 DIAGNOSIS — J4489 Other specified chronic obstructive pulmonary disease: Secondary | ICD-10-CM | POA: Insufficient documentation

## 2012-09-03 DIAGNOSIS — K644 Residual hemorrhoidal skin tags: Secondary | ICD-10-CM | POA: Insufficient documentation

## 2012-09-03 HISTORY — PX: COLONOSCOPY: SHX5424

## 2012-09-03 SURGERY — COLONOSCOPY
Anesthesia: Moderate Sedation

## 2012-09-03 MED ORDER — MEPERIDINE HCL 50 MG/ML IJ SOLN
INTRAMUSCULAR | Status: AC
  Filled 2012-09-03: qty 1

## 2012-09-03 MED ORDER — DICYCLOMINE HCL 10 MG PO CAPS: 10.0000 mg | ORAL_CAPSULE | Freq: Three times a day (TID) | ORAL | Status: DC

## 2012-09-03 MED ORDER — MIDAZOLAM HCL 5 MG/5ML IJ SOLN
INTRAMUSCULAR | Status: AC
  Filled 2012-09-03: qty 10

## 2012-09-03 MED ORDER — STERILE WATER FOR IRRIGATION IR SOLN
Status: DC | PRN
  Administered 2012-09-03: 12:00:00

## 2012-09-03 MED ORDER — SODIUM CHLORIDE 0.9 % IV SOLN
INTRAVENOUS | Status: DC
  Administered 2012-09-03: 10:00:00 via INTRAVENOUS

## 2012-09-03 MED ORDER — MIDAZOLAM HCL 5 MG/5ML IJ SOLN
INTRAMUSCULAR | Status: DC | PRN
  Administered 2012-09-03 (×2): 2 mg via INTRAVENOUS
  Administered 2012-09-03: 3 mg via INTRAVENOUS

## 2012-09-03 MED ORDER — MEPERIDINE HCL 50 MG/ML IJ SOLN
INTRAMUSCULAR | Status: DC | PRN
  Administered 2012-09-03 (×2): 25 mg via INTRAVENOUS

## 2012-09-03 NOTE — H&P (Signed)
Christopher Gould is an 57 y.o. male.   Chief Complaint: Patient's here for colonoscopy. HPI: This is [name] who is here for screening colonoscopy. This is patient's first exam. He denies abdominal pain melena rectal bleeding. Complains of postprandial diarrhea. He's had this symptom since her gallbladder surgery several years ago. He rarely has nocturnal all movements. He is left leg pain secondary to nerve damage daily this disease for which he had surgery in fusion in 2010. History is negative for colorectal carcinoma..  Past Medical History  Diagnosis Date  . COPD (chronic obstructive pulmonary disease)   . GERD (gastroesophageal reflux disease)   . Asthma     Past Surgical History  Procedure Laterality Date  . Spine surgery    . Cholecystectomy  1999    Family History  Problem Relation Age of Onset  . Heart disease Mother    Social History:  reports that he quit smoking about 16 months ago. He quit smokeless tobacco use about 20 years ago. He reports that he does not drink alcohol or use illicit drugs.  Allergies: No Known Allergies  Medications Prior to Admission  Medication Sig Dispense Refill  . HYDROcodone-acetaminophen (NORCO/VICODIN) 5-325 MG per tablet Take 1 tablet by mouth 2 (two) times daily.  60 tablet  0  . MOVIPREP 100 G SOLR       . PROVENTIL HFA 108 (90 BASE) MCG/ACT inhaler         No results found for this or any previous visit (from the past 48 hour(s)). No results found.  ROS  Blood pressure 129/85, pulse 76, temperature 97.4 F (36.3 C), temperature source Oral, resp. rate 16, SpO2 96.00%. Physical Exam  Constitutional: He appears well-developed and well-nourished.  HENT:  Mouth/Throat: Oropharynx is clear and moist.  Eyes: Conjunctivae are normal. No scleral icterus.  Neck: No thyromegaly present.  Cardiovascular: Normal rate, regular rhythm and normal heart sounds.   No murmur heard. Respiratory: Effort normal and breath sounds  normal.  GI: Soft. He exhibits no distension. There is no tenderness.  Musculoskeletal: He exhibits no edema.  Lymphadenopathy:    He has no cervical adenopathy.  Neurological: He is alert.  Skin: Skin is warm and dry.     Assessment/Plan Average risk screening colonoscopy. Patient has typical symptoms of IBS.  Moriyah Byington U 09/03/2012, 12:05 PM

## 2012-09-03 NOTE — Op Note (Signed)
COLONOSCOPY PROCEDURE REPORT  PATIENT:  Christopher Gould  MR#:  [id number] Birthdate:  02-26-1956, 58 y.o., male Endoscopist:  Dr. Malissa Hippo, MD Referred By:  Dr. Colette Ribas, MD Procedure Date: 09/03/2012  Procedure:   Colonoscopy with snare polypectomy.  Indications: Patient is 57 year old Caucasian male is undergoing average risk screening colonoscopy.  Informed Consent:  The procedure and risks were reviewed with the patient and informed consent was obtained.  Medications:  Demerol 50 mg IV Versed 7 mg IV  Description of procedure:  After a digital rectal exam was performed, that colonoscope was advanced from the anus through the rectum and colon to the area of the cecum, ileocecal valve and appendiceal orifice. The cecum was deeply intubated. These structures were well-seen and photographed for the record. From the level of the cecum and ileocecal valve, the scope was slowly and cautiously withdrawn. The mucosal surfaces were carefully surveyed utilizing scope tip to flexion to facilitate fold flattening as needed. The scope was pulled down into the rectum where a thorough exam including retroflexion was performed. Terminal ileum was also examined.  Findings:   Prep excellent. Normal mucosa of terminal ileum. Two small polyps ablated via cold biopsy and submitted together. These are located at ascending at optimal transverse colon. 6 mm white long tubular polyp at mid sigmoid colon. Single Hemoclip applied to polypectomy site. Normal rectal mucosa. Small hemorrhoids below the dentate line.   Therapeutic/Diagnostic Maneuvers Performed:  See above  Complications:  None  Cecal Withdrawal Time:  13 minutes  Impression:  Normal mucosa of terminal ileum. Two small polyps ablated via cold biopsy and submitted together(ascending and proximal transverse colon). 6 mm white long tubular polyp snared from its sigmoid colon and single Hemoclip applied to polypectomy  site. Small hemorrhoids below the dentate line.  Recommendations:  Standard instructions given. I will contact patient with biopsy results and further recommendations. Patient advised not to undergo MRI antral Hemoclip passed passed.  Christopher Gould  09/03/2012 12:48 PM  CC: Dr. Phillips Odor, Chancy Hurter, MD & Dr. Bonnetta Barry ref. provider found

## 2012-09-04 ENCOUNTER — Other Ambulatory Visit: Payer: Self-pay | Admitting: *Deleted

## 2012-09-04 MED ORDER — HYDROCODONE-ACETAMINOPHEN 5-325 MG PO TABS: 1.0000 | ORAL_TABLET | Freq: Two times a day (BID) | ORAL | Status: DC

## 2012-09-05 ENCOUNTER — Encounter (HOSPITAL_COMMUNITY): Payer: Self-pay | Admitting: Internal Medicine

## 2012-09-08 ENCOUNTER — Encounter (INDEPENDENT_AMBULATORY_CARE_PROVIDER_SITE_OTHER): Payer: Self-pay | Admitting: *Deleted

## 2012-10-07 ENCOUNTER — Telehealth: Payer: Self-pay | Admitting: *Deleted

## 2012-10-07 ENCOUNTER — Ambulatory Visit: Payer: Private Health Insurance - Indemnity | Admitting: Physical Medicine & Rehabilitation

## 2012-10-07 MED ORDER — HYDROCODONE-ACETAMINOPHEN 5-325 MG PO TABS: 1.0000 | ORAL_TABLET | Freq: Two times a day (BID) | ORAL | Status: DC

## 2012-10-07 NOTE — Telephone Encounter (Signed)
Refill hydrocodone.  Last 09/04/12.  Refilled and Mr Gorin notified.

## 2012-11-13 ENCOUNTER — Ambulatory Visit (HOSPITAL_BASED_OUTPATIENT_CLINIC_OR_DEPARTMENT_OTHER): Payer: 59 | Admitting: Physical Medicine & Rehabilitation

## 2012-11-13 ENCOUNTER — Encounter: Payer: 59 | Attending: Physical Medicine & Rehabilitation

## 2012-11-13 ENCOUNTER — Encounter: Payer: Self-pay | Admitting: Physical Medicine & Rehabilitation

## 2012-11-13 VITALS — BP 126/81 | HR 82 | Resp 14 | Ht 66.0 in | Wt 173.0 lb

## 2012-11-13 DIAGNOSIS — G5780 Other specified mononeuropathies of unspecified lower limb: Secondary | ICD-10-CM | POA: Insufficient documentation

## 2012-11-13 DIAGNOSIS — G5781 Other specified mononeuropathies of right lower limb: Secondary | ICD-10-CM

## 2012-11-13 MED ORDER — HYDROCODONE-ACETAMINOPHEN 5-325 MG PO TABS: 1.0000 | ORAL_TABLET | Freq: Two times a day (BID) | ORAL | Status: DC

## 2012-11-13 NOTE — Progress Notes (Signed)
PROCEDURE: Right saphenous nerve block.  INDICATION: Saphenous neuritis with pain only partially response to  medication management including narcotic analgesics. Last injection was  performed in October 2011. Informed consent was obtained after  describing the risks and benefits of the procedure. These include  bleeding, bruising, infection. He elects to proceed and has given  written consent. EMG stimulator utilized, a right medial tibial flare.  Paresthesias reproduced to the ankle area, area was marked, prepped with  chlorhexidine, entered with 22-gauge 40-mm needle electrode under E-stem  guidance. Medial paresthesias reproduced, 1 mL of 40 mg/mL Depo-Medrol  and 4 mL .25% marcaine.  

## 2012-11-13 NOTE — Patient Instructions (Addendum)
Next visit is for a followup. We'll see how the nerve block is working still. Continue hydrocodone twice a day

## 2012-12-16 ENCOUNTER — Other Ambulatory Visit: Payer: Self-pay

## 2012-12-16 MED ORDER — HYDROCODONE-ACETAMINOPHEN 5-325 MG PO TABS: 1.0000 | ORAL_TABLET | Freq: Two times a day (BID) | ORAL | Status: DC

## 2013-01-19 ENCOUNTER — Telehealth: Payer: Self-pay | Admitting: Physical Medicine & Rehabilitation

## 2013-01-19 NOTE — Telephone Encounter (Signed)
Left message to call the office and schedule appointment for his refills.Christopher Gould

## 2013-01-20 ENCOUNTER — Encounter: Payer: Self-pay | Admitting: Physical Medicine and Rehabilitation

## 2013-01-20 ENCOUNTER — Encounter: Payer: 59 | Attending: Physical Medicine and Rehabilitation | Admitting: Physical Medicine and Rehabilitation

## 2013-01-20 VITALS — BP 143/73 | HR 75 | Resp 14 | Ht 66.0 in | Wt 172.0 lb

## 2013-01-20 DIAGNOSIS — Z79899 Other long term (current) drug therapy: Secondary | ICD-10-CM

## 2013-01-20 DIAGNOSIS — Z5181 Encounter for therapeutic drug level monitoring: Secondary | ICD-10-CM

## 2013-01-20 DIAGNOSIS — IMO0002 Reserved for concepts with insufficient information to code with codable children: Secondary | ICD-10-CM | POA: Insufficient documentation

## 2013-01-20 DIAGNOSIS — M79609 Pain in unspecified limb: Secondary | ICD-10-CM | POA: Insufficient documentation

## 2013-01-20 DIAGNOSIS — G5780 Other specified mononeuropathies of unspecified lower limb: Secondary | ICD-10-CM

## 2013-01-20 MED ORDER — HYDROCODONE-ACETAMINOPHEN 5-325 MG PO TABS: 1.0000 | ORAL_TABLET | Freq: Two times a day (BID) | ORAL | Status: DC

## 2013-01-20 NOTE — Patient Instructions (Signed)
Stay as active as tolerated. 

## 2013-01-20 NOTE — Progress Notes (Signed)
Subjective:    Patient ID: Christopher Gould, male    DOB: [birth year], 57 y.o.   MRN: [id number]  HPI The patient is a 57 year old male, who presents with right low leg pain , on the medial side . The symptoms started 5 years ago, after a PSF L5-S1, 2009, by Dr. Otelia Sergeant. The patient complains about moderate to severe pain, which radiates into his right foot.  Patient also complains about sharp stabbing and tingling in the same area . Applying heat, taking medications , nerve block alleviate the symptoms. Prolonged standing and walking aggrevates the symptoms. The patient grades his pain as a  3/10. He had a nerve block on 11/13/12, by Dr. Doroteo Bradford which gave him about 80% relief. Pain Inventory Average Pain 8 Pain Right Now 6 My pain is sharp, burning, stabbing and tingling  In the last 24 hours, has pain interfered with the following? General activity 6 Relation with others 1 Enjoyment of life 4 What TIME of day is your pain at its worst? varies Sleep (in general) Fair  Pain is worse with: walking, bending, sitting, inactivity and standing Pain improves with: rest, heat/ice and medication Relief from Meds: 9  Mobility walk without assistance how many minutes can you walk? 20 ability to climb steps?  yes do you drive?  yes Do you have any goals in this area?  yes  Function employed # of hrs/week 46.5 Do you have any goals in this area?  yes  Neuro/Psych No problems in this area  Prior Studies Any changes since last visit?  no  Physicians involved in your care Any changes since last visit?  no   Family History  Problem Relation Age of Onset  . Heart disease Mother    History   Social History  . Marital Status: Married    Spouse Name: N/A    Number of Children: N/A  . Years of Education: N/A   Social History Main Topics  . Smoking status: Former Smoker    Quit date: 04/06/2011  . Smokeless tobacco: Former Neurosurgeon    Quit date: 10/29/1991  . Alcohol Use: No  . Drug  Use: No  . Sexual Activity: None   Other Topics Concern  . None   Social History Narrative  . None   Past Surgical History  Procedure Laterality Date  . Spine surgery    . Cholecystectomy  1999  . Colonoscopy N/A 09/03/2012    Procedure: COLONOSCOPY;  Surgeon: Malissa Hippo, MD;  [facility]: [facility];  Service: Endoscopy;  Laterality: N/A;  1030-moved to 1125 Ann to notify pt   Past Medical History  Diagnosis Date  . COPD (chronic obstructive pulmonary disease)   . GERD (gastroesophageal reflux disease)   . Asthma    BP 143/73  Pulse 75  Resp 14  Ht 5\' 6"  (1.676 m)  Wt 172 lb (78.019 kg)  BMI 27.77 kg/m2  SpO2 96%      Review of Systems  All other systems reviewed and are negative.       Objective:   Physical Exam  Constitutional: He is oriented to person, place, and time. He appears well-developed and well-nourished.  HENT:  Head: Normocephalic.  Neck: Neck supple.  Musculoskeletal: He exhibits tenderness.  Neurological: He is alert and oriented to person, place, and time.  Skin: Skin is warm and dry.  Psychiatric: He has a normal mood and affect.  Symmetric normal motor tone is noted throughout. Normal muscle bulk.  Muscle testing reveals 5/5 muscle strength of the upper extremity, and 5/5 of the lower extremity, except right iliopsoas 4/5, right gastrocnemius 3/5. Full range of motion in upper and lower extremities. ROM of spine is restricted. Fine motor movements are normal in both hands.  DTR in the upper and lower extremity are present and symmetric 3+, except right patella reflex 2+, right achilles tendon reflex 1-2+ . No clonus is noted.  Patient arises from chair without difficulty. Narrow based gait with normal arm swing bilateral , able to walk on heels, not able to walk on right toes, left is ok  .         Assessment & Plan:  This is a 57 year old male with 1.Right saphenus neuralgia, post PSF L5-S1, by Dr. Otelia Sergeant, in 2009  Plan : Continue  with nerve block, which is successful. Continue with medication, refilled Hydrocodone 5mg , continue with swimming program, recommended to also continue in the winter if possible. Follow up in 1 month

## 2013-02-13 ENCOUNTER — Encounter: Payer: 59 | Attending: Physical Medicine & Rehabilitation

## 2013-02-13 ENCOUNTER — Ambulatory Visit: Payer: 59 | Admitting: Physical Medicine & Rehabilitation

## 2013-02-13 DIAGNOSIS — G5780 Other specified mononeuropathies of unspecified lower limb: Secondary | ICD-10-CM | POA: Insufficient documentation

## 2013-02-17 ENCOUNTER — Encounter: Payer: Self-pay | Admitting: Physical Medicine and Rehabilitation

## 2013-02-17 ENCOUNTER — Encounter: Payer: 59 | Attending: Physical Medicine and Rehabilitation | Admitting: Physical Medicine and Rehabilitation

## 2013-02-17 VITALS — BP 125/81 | HR 86 | Resp 14 | Ht 66.0 in | Wt 171.6 lb

## 2013-02-17 DIAGNOSIS — G5780 Other specified mononeuropathies of unspecified lower limb: Secondary | ICD-10-CM

## 2013-02-17 DIAGNOSIS — Z981 Arthrodesis status: Secondary | ICD-10-CM | POA: Insufficient documentation

## 2013-02-17 DIAGNOSIS — G5790 Unspecified mononeuropathy of unspecified lower limb: Secondary | ICD-10-CM | POA: Insufficient documentation

## 2013-02-17 DIAGNOSIS — M79609 Pain in unspecified limb: Secondary | ICD-10-CM | POA: Insufficient documentation

## 2013-02-17 DIAGNOSIS — G5781 Other specified mononeuropathies of right lower limb: Secondary | ICD-10-CM

## 2013-02-17 DIAGNOSIS — M961 Postlaminectomy syndrome, not elsewhere classified: Secondary | ICD-10-CM

## 2013-02-17 DIAGNOSIS — R209 Unspecified disturbances of skin sensation: Secondary | ICD-10-CM | POA: Insufficient documentation

## 2013-02-17 MED ORDER — HYDROCODONE-ACETAMINOPHEN 5-325 MG PO TABS: 1.0000 | ORAL_TABLET | Freq: Two times a day (BID) | ORAL | Status: DC

## 2013-02-17 NOTE — Progress Notes (Signed)
Subjective:    Patient ID: Christopher Gould, male    DOB: 01-09-1956, 57 y.o.   MRN: [id number]  HPI The patient is a 57 year old male, who presents with right low leg pain , on the medial side . The symptoms started 5 years ago, after a PSF L5-S1, 2009, by Dr. Otelia Sergeant. The patient complains about moderate to severe pain, which radiates into his right foot. Patient also complains about sharp stabbing and tingling in the same area . Applying heat, taking medications , nerve block alleviate the symptoms. Prolonged standing and walking aggrevates the symptoms. The patient grades his pain as a 3/10. He had a nerve block on 11/13/12, by Dr. Doroteo Bradford which gave him about 80% relief. The problem has been stable.  Pain Inventory Average Pain 9 Pain Right Now 3 My pain is sharp, burning, stabbing and tingling  In the last 24 hours, has pain interfered with the following? General activity 2 Relation with others 1 Enjoyment of life 3 What TIME of day is your pain at its worst? all Sleep (in general) Fair  Pain is worse with: walking, bending, sitting, inactivity and standing Pain improves with: heat/ice Relief from Meds: 8  Mobility walk without assistance  Function employed # of hrs/week 46.5 what is your job? [employer]  Neuro/Psych No problems in this area  Prior Studies Any changes since last visit?  no  Physicians involved in your care Any changes since last visit?  no   Family History  Problem Relation Age of Onset  . Heart disease Mother    History   Social History  . Marital Status: Married    Spouse Name: N/A    Number of Children: N/A  . Years of Education: N/A   Social History Main Topics  . Smoking status: Former Smoker    Quit date: 04/06/2011  . Smokeless tobacco: Former Neurosurgeon    Quit date: 10/29/1991  . Alcohol Use: No  . Drug Use: No  . Sexual Activity: None   Other Topics Concern  . None   Social History Narrative  . None   Past Surgical History   Procedure Laterality Date  . Spine surgery    . Cholecystectomy  1999  . Colonoscopy N/A 09/03/2012    Procedure: COLONOSCOPY;  Surgeon: Malissa Hippo, MD;  [facility];  Service: Endoscopy;  Laterality: N/A;  1030-moved to 1125 Ann to notify pt   Past Medical History  Diagnosis Date  . COPD (chronic obstructive pulmonary disease)   . GERD (gastroesophageal reflux disease)   . Asthma    BP 125/81  Pulse 86  Resp 14  Ht 5\' 6"  (1.676 m)  Wt 171 lb 9.6 oz (77.837 kg)  BMI 27.71 kg/m2  SpO2 95%    Review of Systems  Musculoskeletal:       Leg pain  All other systems reviewed and are negative.       Objective:   Physical Exam Constitutional: He is oriented to person, place, and time. He appears well-developed and well-nourished.  HENT:  Head: Normocephalic.  Neck: Neck supple.  Musculoskeletal: He exhibits tenderness.  Neurological: He is alert and oriented to person, place, and time.  Skin: Skin is warm and dry.  Psychiatric: He has a normal mood and affect.  Symmetric normal motor tone is noted throughout. Normal muscle bulk. Muscle testing reveals 5/5 muscle strength of the upper extremity, and 5/5 of the lower extremity, except right iliopsoas 4/5,  right gastrocnemius 3/5. Full range of motion in upper and lower extremities. ROM of spine is restricted. Fine motor movements are normal in both hands.  DTR in the upper and lower extremity are present and symmetric 3+, except right patella reflex 2+, right achilles tendon reflex 1-2+ . No clonus is noted.  Patient arises from chair without difficulty. Narrow based gait with normal arm swing bilateral , able to walk on heels, not able to walk on right toes, left is ok .         Assessment & Plan:  This is a 57 year old male with  1.Right saphenus neuralgia, post PSF L5-S1, by Dr. Otelia Sergeant, in 2009  Plan :  Continue with nerve block, which is successful.  Continue with medication, refilled Hydrocodone 5mg  bid  # 60, continue with swimming program, recommended to also continue in the winter if possible. Advised patient to continue with his exercise program as pain permits. Follow up in 1 month

## 2013-02-17 NOTE — Patient Instructions (Signed)
Continue with your fitness program as tolerated

## 2013-03-12 ENCOUNTER — Other Ambulatory Visit: Payer: Self-pay | Admitting: *Deleted

## 2013-03-12 MED ORDER — HYDROCODONE-ACETAMINOPHEN 5-325 MG PO TABS: 1.0000 | ORAL_TABLET | Freq: Two times a day (BID) | ORAL | Status: DC

## 2013-03-12 NOTE — Telephone Encounter (Signed)
rx printed early for controlled medication for the visit with RN on 03/20/13 (to be signed by MD) 

## 2013-04-16 ENCOUNTER — Other Ambulatory Visit: Payer: Self-pay | Admitting: *Deleted

## 2013-04-16 MED ORDER — HYDROCODONE-ACETAMINOPHEN 5-325 MG PO TABS: 1.0000 | ORAL_TABLET | Freq: Two times a day (BID) | ORAL | Status: DC

## 2013-04-16 NOTE — Telephone Encounter (Signed)
RX printed early for controlled medication for the visit with RN on 04/20/13 (to be signed by MD)

## 2013-04-20 ENCOUNTER — Encounter: Payer: 59 | Attending: Physical Medicine & Rehabilitation | Admitting: *Deleted

## 2013-04-20 ENCOUNTER — Encounter: Payer: Self-pay | Admitting: *Deleted

## 2013-04-20 VITALS — BP 116/71 | HR 86 | Resp 14

## 2013-04-20 DIAGNOSIS — Z981 Arthrodesis status: Secondary | ICD-10-CM | POA: Insufficient documentation

## 2013-04-20 DIAGNOSIS — Z79899 Other long term (current) drug therapy: Secondary | ICD-10-CM | POA: Insufficient documentation

## 2013-04-20 DIAGNOSIS — G5771 Causalgia of right lower limb: Secondary | ICD-10-CM

## 2013-04-20 DIAGNOSIS — G5780 Other specified mononeuropathies of unspecified lower limb: Secondary | ICD-10-CM | POA: Insufficient documentation

## 2013-04-20 DIAGNOSIS — M961 Postlaminectomy syndrome, not elsewhere classified: Secondary | ICD-10-CM

## 2013-04-20 DIAGNOSIS — G5781 Other specified mononeuropathies of right lower limb: Secondary | ICD-10-CM

## 2013-04-20 DIAGNOSIS — J4489 Other specified chronic obstructive pulmonary disease: Secondary | ICD-10-CM | POA: Insufficient documentation

## 2013-04-20 DIAGNOSIS — Z87891 Personal history of nicotine dependence: Secondary | ICD-10-CM | POA: Insufficient documentation

## 2013-04-20 DIAGNOSIS — J449 Chronic obstructive pulmonary disease, unspecified: Secondary | ICD-10-CM | POA: Insufficient documentation

## 2013-04-20 DIAGNOSIS — K219 Gastro-esophageal reflux disease without esophagitis: Secondary | ICD-10-CM | POA: Insufficient documentation

## 2013-04-20 NOTE — Progress Notes (Signed)
Here for pill count and medication refills.Left his pills in the car today.  Did not make him go back for them since the weather is bad and he has not had a problem with  bringing pills to appt before and UDS' are consistent..    VSS    Pain level:3  No changes since last visit.  Refill given of hydrocodone 5/325 # 60  Return to see RN for med refill next month and Dr Letta Pate in March.

## 2013-04-20 NOTE — Patient Instructions (Signed)
Follow up one month with RN for pill count and med refill and MD in 2 months

## 2013-05-18 ENCOUNTER — Other Ambulatory Visit: Payer: Self-pay | Admitting: *Deleted

## 2013-05-18 MED ORDER — HYDROCODONE-ACETAMINOPHEN 5-325 MG PO TABS: 1.0000 | ORAL_TABLET | Freq: Two times a day (BID) | ORAL | Status: DC

## 2013-05-18 NOTE — Telephone Encounter (Signed)
RX printed early for controlled medication for the visit with RN on 05/19/13 (to be signed by MD)

## 2013-05-19 ENCOUNTER — Encounter: Payer: 59 | Attending: Physical Medicine & Rehabilitation | Admitting: *Deleted

## 2013-05-19 ENCOUNTER — Encounter: Payer: Self-pay | Admitting: *Deleted

## 2013-05-19 VITALS — BP 110/73 | HR 79 | Resp 14

## 2013-05-19 DIAGNOSIS — R209 Unspecified disturbances of skin sensation: Secondary | ICD-10-CM | POA: Insufficient documentation

## 2013-05-19 DIAGNOSIS — J449 Chronic obstructive pulmonary disease, unspecified: Secondary | ICD-10-CM | POA: Insufficient documentation

## 2013-05-19 DIAGNOSIS — Z87891 Personal history of nicotine dependence: Secondary | ICD-10-CM | POA: Insufficient documentation

## 2013-05-19 DIAGNOSIS — G5771 Causalgia of right lower limb: Secondary | ICD-10-CM

## 2013-05-19 DIAGNOSIS — G5780 Other specified mononeuropathies of unspecified lower limb: Secondary | ICD-10-CM | POA: Insufficient documentation

## 2013-05-19 DIAGNOSIS — M961 Postlaminectomy syndrome, not elsewhere classified: Secondary | ICD-10-CM | POA: Insufficient documentation

## 2013-05-19 DIAGNOSIS — M79609 Pain in unspecified limb: Secondary | ICD-10-CM | POA: Insufficient documentation

## 2013-05-19 DIAGNOSIS — J4489 Other specified chronic obstructive pulmonary disease: Secondary | ICD-10-CM | POA: Insufficient documentation

## 2013-05-19 DIAGNOSIS — K219 Gastro-esophageal reflux disease without esophagitis: Secondary | ICD-10-CM | POA: Insufficient documentation

## 2013-05-19 NOTE — Patient Instructions (Signed)
Has appt for [place] 06/15/13.  We will schedule your next nerve block for April.

## 2013-05-19 NOTE — Progress Notes (Signed)
Here for pill count and medication refills.   hydrocodone 04/21/13 #60  Today NV#8  Pill count is appropriate.  Refill given for his hydrocodone.  He will return to see me in a month for a refill and then see Dr Letta Pate in April for his nerve block.  He has had no falls and fall risk is low but we talked about fall prevention and I have given him a handout on fall prevention in the home .

## 2013-06-15 ENCOUNTER — Encounter: Payer: 59 | Attending: Physical Medicine & Rehabilitation

## 2013-06-15 ENCOUNTER — Encounter: Payer: Self-pay | Admitting: Physical Medicine & Rehabilitation

## 2013-06-15 ENCOUNTER — Ambulatory Visit (HOSPITAL_BASED_OUTPATIENT_CLINIC_OR_DEPARTMENT_OTHER): Payer: 59 | Admitting: Physical Medicine & Rehabilitation

## 2013-06-15 VITALS — BP 135/85 | HR 80 | Resp 14 | Ht 66.0 in | Wt 178.0 lb

## 2013-06-15 DIAGNOSIS — Z87891 Personal history of nicotine dependence: Secondary | ICD-10-CM | POA: Insufficient documentation

## 2013-06-15 DIAGNOSIS — J4489 Other specified chronic obstructive pulmonary disease: Secondary | ICD-10-CM | POA: Insufficient documentation

## 2013-06-15 DIAGNOSIS — G5770 Causalgia of unspecified lower limb: Secondary | ICD-10-CM

## 2013-06-15 DIAGNOSIS — G5780 Other specified mononeuropathies of unspecified lower limb: Secondary | ICD-10-CM

## 2013-06-15 DIAGNOSIS — J449 Chronic obstructive pulmonary disease, unspecified: Secondary | ICD-10-CM | POA: Insufficient documentation

## 2013-06-15 DIAGNOSIS — G5771 Causalgia of right lower limb: Secondary | ICD-10-CM

## 2013-06-15 DIAGNOSIS — Z79899 Other long term (current) drug therapy: Secondary | ICD-10-CM | POA: Insufficient documentation

## 2013-06-15 DIAGNOSIS — Z981 Arthrodesis status: Secondary | ICD-10-CM | POA: Insufficient documentation

## 2013-06-15 DIAGNOSIS — K219 Gastro-esophageal reflux disease without esophagitis: Secondary | ICD-10-CM | POA: Insufficient documentation

## 2013-06-15 MED ORDER — HYDROCODONE-ACETAMINOPHEN 5-325 MG PO TABS: 1.0000 | ORAL_TABLET | Freq: Two times a day (BID) | ORAL | Status: DC

## 2013-06-15 NOTE — Progress Notes (Signed)
   Subjective:    Patient ID: Christopher Gould, male    DOB: [birth year], 58 y.o.   MRN: [id number]  HPI Last saphenous nerve block right side August 2014. Patient having increased pain in that right ankle and leg Pain Inventory Average Pain 9 Pain Right Now 3 My pain is sharp, burning, stabbing, tingling and aching  In the last 24 hours, has pain interfered with the following? General activity 3 Relation with others 1 Enjoyment of life 1 What TIME of day is your pain at its worst? all the time Sleep (in general) Fair  Pain is worse with: walking, bending, sitting, inactivity and standing Pain improves with: heat/ice and medication Relief from Meds: 3  Mobility walk without assistance how many minutes can you walk? 20 ability to climb steps?  yes do you drive?  yes Do you have any goals in this area?  yes  Function employed # of hrs/week [employer] Do you have any goals in this area?  yes  Neuro/Psych No problems in this area  Prior Studies Any changes since last visit?  no  Physicians involved in your care Any changes since last visit?  no   Family History  Problem Relation Age of Onset  . Heart disease Mother    History   Social History  . Marital Status: Married    Spouse Name: N/A    Number of Children: N/A  . Years of Education: N/A   Social History Main Topics  . Smoking status: Former Smoker    Quit date: 04/06/2011  . Smokeless tobacco: Former Systems developer    Quit date: 10/29/1991  . Alcohol Use: No  . Drug Use: No  . Sexual Activity: None   Other Topics Concern  . None   Social History Narrative  . None   Past Surgical History  Procedure Laterality Date  . Spine surgery    . Cholecystectomy  1999  . Colonoscopy N/A 09/03/2012    Procedure: COLONOSCOPY;  Surgeon: Rogene Houston, MD;  [facility]: [facility];  Service: Endoscopy;  Laterality: N/A;  1030-moved to 1125 Ann to notify pt   Past Medical History  Diagnosis Date  . COPD  (chronic obstructive pulmonary disease)   . GERD (gastroesophageal reflux disease)   . Asthma    BP 135/85  Pulse 80  Resp 14  Ht 5\' 6"  (1.676 m)  Wt 178 lb (80.74 kg)  BMI 28.74 kg/m2  SpO2 97%  Opioid Risk Score:   Fall Risk Score: Low Fall Risk (0-5 points) (patient educate handout declined)   Review of Systems  Musculoskeletal: Positive for back pain.  All other systems reviewed and are negative.       Objective:   Physical Exam  Hypersensitive to touch R medial distal leg and medical malleolus No LE edema R quad 4/5 L quad 5/5      Assessment & Plan:  1.  R saphenous neuritis repeat block next month, cont hydrocodone BID

## 2013-06-15 NOTE — Patient Instructions (Signed)
Nerve block next visit

## 2013-07-14 ENCOUNTER — Ambulatory Visit (HOSPITAL_BASED_OUTPATIENT_CLINIC_OR_DEPARTMENT_OTHER): Payer: 59 | Admitting: Physical Medicine & Rehabilitation

## 2013-07-14 ENCOUNTER — Encounter: Payer: 59 | Attending: Physical Medicine & Rehabilitation

## 2013-07-14 ENCOUNTER — Encounter: Payer: Self-pay | Admitting: Physical Medicine & Rehabilitation

## 2013-07-14 VITALS — BP 118/76 | HR 98 | Resp 14 | Ht 66.0 in | Wt 178.0 lb

## 2013-07-14 DIAGNOSIS — J449 Chronic obstructive pulmonary disease, unspecified: Secondary | ICD-10-CM | POA: Insufficient documentation

## 2013-07-14 DIAGNOSIS — G5780 Other specified mononeuropathies of unspecified lower limb: Secondary | ICD-10-CM | POA: Diagnosis not present

## 2013-07-14 DIAGNOSIS — K219 Gastro-esophageal reflux disease without esophagitis: Secondary | ICD-10-CM | POA: Insufficient documentation

## 2013-07-14 DIAGNOSIS — Z5181 Encounter for therapeutic drug level monitoring: Secondary | ICD-10-CM | POA: Diagnosis not present

## 2013-07-14 DIAGNOSIS — Z87891 Personal history of nicotine dependence: Secondary | ICD-10-CM | POA: Insufficient documentation

## 2013-07-14 DIAGNOSIS — Z981 Arthrodesis status: Secondary | ICD-10-CM | POA: Insufficient documentation

## 2013-07-14 DIAGNOSIS — Z79899 Other long term (current) drug therapy: Secondary | ICD-10-CM

## 2013-07-14 DIAGNOSIS — J4489 Other specified chronic obstructive pulmonary disease: Secondary | ICD-10-CM | POA: Insufficient documentation

## 2013-07-14 MED ORDER — HYDROCODONE-ACETAMINOPHEN 5-325 MG PO TABS: 1.0000 | ORAL_TABLET | Freq: Two times a day (BID) | ORAL | Status: DC

## 2013-07-14 NOTE — Progress Notes (Signed)
PROCEDURE: Right saphenous nerve block.  INDICATION: Saphenous neuritis with pain only partially response to  medication management including narcotic analgesics. Last injection was  performed in October 2011. Informed consent was obtained after  describing the risks and benefits of the procedure. These include  bleeding, bruising, infection. He elects to proceed and has given  written consent. EMG stimulator utilized, a right medial tibial flare.  Paresthesias reproduced to the ankle area, area was marked, prepped with  chlorhexidine, entered with 22-gauge 40-mm needle electrode under E-stem  guidance. Medial paresthesias reproduced, 1 mL of 40 mg/mL Depo-Medrol  and 4 mL .25% marcaine.  

## 2013-07-14 NOTE — Patient Instructions (Signed)
Next visit is with nurse practitioner 1 month  Plan on nerve block in 6 months

## 2013-07-28 ENCOUNTER — Other Ambulatory Visit: Payer: Self-pay | Admitting: Physical Medicine & Rehabilitation

## 2013-07-28 NOTE — Progress Notes (Signed)
Urine drug screen from 07/14/2013 was consistent. 

## 2013-08-10 ENCOUNTER — Other Ambulatory Visit (HOSPITAL_COMMUNITY): Payer: Self-pay

## 2013-08-10 DIAGNOSIS — J441 Chronic obstructive pulmonary disease with (acute) exacerbation: Secondary | ICD-10-CM

## 2013-08-11 ENCOUNTER — Encounter: Payer: Self-pay | Admitting: Registered Nurse

## 2013-08-11 ENCOUNTER — Encounter: Payer: 59 | Attending: Physical Medicine & Rehabilitation | Admitting: Registered Nurse

## 2013-08-11 VITALS — BP 127/82 | HR 71 | Resp 14 | Ht 66.0 in | Wt 180.0 lb

## 2013-08-11 DIAGNOSIS — Z79899 Other long term (current) drug therapy: Secondary | ICD-10-CM

## 2013-08-11 DIAGNOSIS — G5770 Causalgia of unspecified lower limb: Secondary | ICD-10-CM

## 2013-08-11 DIAGNOSIS — J4489 Other specified chronic obstructive pulmonary disease: Secondary | ICD-10-CM | POA: Insufficient documentation

## 2013-08-11 DIAGNOSIS — G5780 Other specified mononeuropathies of unspecified lower limb: Secondary | ICD-10-CM | POA: Insufficient documentation

## 2013-08-11 DIAGNOSIS — R209 Unspecified disturbances of skin sensation: Secondary | ICD-10-CM | POA: Insufficient documentation

## 2013-08-11 DIAGNOSIS — M961 Postlaminectomy syndrome, not elsewhere classified: Secondary | ICD-10-CM | POA: Insufficient documentation

## 2013-08-11 DIAGNOSIS — K219 Gastro-esophageal reflux disease without esophagitis: Secondary | ICD-10-CM | POA: Insufficient documentation

## 2013-08-11 DIAGNOSIS — Z5181 Encounter for therapeutic drug level monitoring: Secondary | ICD-10-CM

## 2013-08-11 DIAGNOSIS — G5771 Causalgia of right lower limb: Secondary | ICD-10-CM

## 2013-08-11 DIAGNOSIS — Z87891 Personal history of nicotine dependence: Secondary | ICD-10-CM | POA: Insufficient documentation

## 2013-08-11 DIAGNOSIS — J449 Chronic obstructive pulmonary disease, unspecified: Secondary | ICD-10-CM | POA: Insufficient documentation

## 2013-08-11 DIAGNOSIS — M79609 Pain in unspecified limb: Secondary | ICD-10-CM | POA: Insufficient documentation

## 2013-08-11 MED ORDER — HYDROCODONE-ACETAMINOPHEN 5-325 MG PO TABS: 1.0000 | ORAL_TABLET | Freq: Two times a day (BID) | ORAL | Status: DC

## 2013-08-11 NOTE — Progress Notes (Signed)
Subjective:    Patient ID: Christopher Gould, male    DOB: [birth year], 58 y.o.   MRN: [id number]  HPI: [name] is a 58 year old male who returns for follow up for chronic pain and medication refill. He says his pain is located in right lower leg from the calf to the ankle. He had right saphenous nerve block on 07/14/2013 with good results noted. He says his pain relief was 70-80%. He is unable to perform an exercise regime due to recent diagnosis of emphysema and he's being worked up for the possibility of [place] by Pulmonology.  Pain Inventory Average Pain 9 Pain Right Now 9 My pain is sharp, burning, stabbing, tingling and aching  In the last 24 hours, has pain interfered with the following? General activity 2 Relation with others 1 Enjoyment of life 2 What TIME of day is your pain at its worst? varies Sleep (in general) Fair  Pain is worse with: walking, bending, sitting, inactivity, standing and some activites Pain improves with: heat/ice Relief from Meds: 8  Mobility walk without assistance how many minutes can you walk? 20 ability to climb steps?  yes do you drive?  yes Do you have any goals in this area?  yes  Function employed # of hrs/week 44 Do you have any goals in this area?  yes  Neuro/Psych tingling  Prior Studies Any changes since last visit?  no  Physicians involved in your care Any changes since last visit?  no   Family History  Problem Relation Age of Onset  . Heart disease Mother    History   Social History  . Marital Status: Married    Spouse Name: N/A    Number of Children: N/A  . Years of Education: N/A   Social History Main Topics  . Smoking status: Former Smoker    Quit date: 04/06/2011  . Smokeless tobacco: Former Systems developer    Quit date: 10/29/1991  . Alcohol Use: No  . Drug Use: No  . Sexual Activity: None   Other Topics Concern  . None   Social History Narrative  . None   Past Surgical History  Procedure  Laterality Date  . Spine surgery    . Cholecystectomy  1999  . Colonoscopy N/A 09/03/2012    Procedure: COLONOSCOPY;  Surgeon: Rogene Houston, MD;  [facility];  Service: Endoscopy;  Laterality: N/A;  1030-moved to 1125 Ann to notify pt   Past Medical History  Diagnosis Date  . COPD (chronic obstructive pulmonary disease)   . GERD (gastroesophageal reflux disease)   . Asthma    BP 127/82  Pulse 71  Resp 14  Ht 5\' 6"  (1.676 m)  Wt 180 lb (81.647 kg)  BMI 29.07 kg/m2  SpO2 93%  Opioid Risk Score:   Fall Risk Score: Low Fall Risk (0-5 points) (patient edcuated handout declined)   Review of Systems  Neurological:       Tingling  All other systems reviewed and are negative.      Objective:   Physical Exam  Nursing note and vitals reviewed. Constitutional: He is oriented to person, place, and time. He appears well-developed and well-nourished.  HENT:  Head: Normocephalic.  Neck: Normal range of motion. Neck supple.  Cardiovascular: Normal rate and regular rhythm.   Pulmonary/Chest: Effort normal and breath sounds normal.  Musculoskeletal:  Normal Muscle Bulk: Muscle Testing reveals: Upper Extremities: Full ROM Muscle Strength 5/5. Back without spinal or  paraspinal tenderness. Lower extremities: Left Leg Full ROM and Muscle Strength 5/5. Right Leg: Decreased ROM flexion produces pain at ankle. Arises fronm chair with ease, Narrow Based Gait.  Neurological: He is alert and oriented to person, place, and time.  Skin: Skin is warm and dry.  Psychiatric: He has a normal mood and affect.          Assessment & Plan:  1. Right Saphenous Neuritis: Continue current medication regime.  Refilled: HYDROcodone 5/325mg  one tablet by mouth BID #60   15 minutes of face to face patient care time was spent during this visit. All questions were encouraged and answered.  F/U in 1 month

## 2013-08-14 ENCOUNTER — Other Ambulatory Visit (HOSPITAL_COMMUNITY): Payer: Self-pay | Admitting: Pulmonary Disease

## 2013-08-14 ENCOUNTER — Ambulatory Visit (HOSPITAL_COMMUNITY)
Admission: RE | Admit: 2013-08-14 | Discharge: 2013-08-14 | Disposition: A | Discharge status: Home / Self Care | Payer: 59 | Source: Ambulatory Visit | Attending: Pulmonary Disease | Admitting: Pulmonary Disease

## 2013-08-14 DIAGNOSIS — J449 Chronic obstructive pulmonary disease, unspecified: Secondary | ICD-10-CM | POA: Insufficient documentation

## 2013-08-14 DIAGNOSIS — M961 Postlaminectomy syndrome, not elsewhere classified: Secondary | ICD-10-CM | POA: Insufficient documentation

## 2013-08-14 DIAGNOSIS — G5770 Causalgia of unspecified lower limb: Secondary | ICD-10-CM | POA: Insufficient documentation

## 2013-08-14 DIAGNOSIS — G5780 Other specified mononeuropathies of unspecified lower limb: Secondary | ICD-10-CM | POA: Insufficient documentation

## 2013-08-14 DIAGNOSIS — R0602 Shortness of breath: Secondary | ICD-10-CM | POA: Insufficient documentation

## 2013-08-14 DIAGNOSIS — J4489 Other specified chronic obstructive pulmonary disease: Secondary | ICD-10-CM | POA: Insufficient documentation

## 2013-08-14 LAB — BLOOD GAS, ARTERIAL
Acid-base deficit: 2.5 mmol/L — ABNORMAL HIGH (ref 0.0–2.0)
BICARBONATE: 20.9 meq/L (ref 20.0–24.0)
FIO2: 0.21 %
O2 SAT: 95 %
PO2 ART: 73.5 mmHg — AB (ref 80.0–100.0)
TCO2: 18.2 mmol/L (ref 0–100)
pCO2 arterial: 33.8 mmHg — ABNORMAL LOW (ref 35.0–45.0)
pH, Arterial: 7.407 (ref 7.350–7.450)

## 2013-08-14 MED ORDER — ALBUTEROL SULFATE (2.5 MG/3ML) 0.083% IN NEBU
2.5000 mg | INHALATION_SOLUTION | Freq: Once | RESPIRATORY_TRACT | Status: AC
  Administered 2013-08-14: 2.5 mg via RESPIRATORY_TRACT

## 2013-08-19 LAB — PULMONARY FUNCTION TEST
DL/VA % pred: 75 %
DL/VA: 3.36 ml/min/mmHg/L
DLCO COR % PRED: 54 %
DLCO COR: 15.44 ml/min/mmHg
DLCO UNC: 15.4 ml/min/mmHg
DLCO unc % pred: 54 %
FEF 25-75 PRE: 0.76 L/s
FEF 25-75 Post: 1.21 L/sec
FEF2575-%Change-Post: 57 %
FEF2575-%PRED-PRE: 27 %
FEF2575-%Pred-Post: 42 %
FEV1-%Change-Post: 17 %
FEV1-%PRED-PRE: 50 %
FEV1-%Pred-Post: 59 %
FEV1-Post: 1.97 L
FEV1-Pre: 1.67 L
FEV1FVC-%Change-Post: 1 %
FEV1FVC-%Pred-Pre: 72 %
FEV6-%CHANGE-POST: 11 %
FEV6-%PRED-POST: 79 %
FEV6-%PRED-PRE: 71 %
FEV6-POST: 3.3 L
FEV6-PRE: 2.96 L
FEV6FVC-%CHANGE-POST: -3 %
FEV6FVC-%PRED-POST: 99 %
FEV6FVC-%Pred-Pre: 102 %
FVC-%Change-Post: 15 %
FVC-%PRED-POST: 80 %
FVC-%PRED-PRE: 69 %
FVC-POST: 3.5 L
FVC-Pre: 3.03 L
POST FEV1/FVC RATIO: 56 %
POST FEV6/FVC RATIO: 94 %
Pre FEV1/FVC ratio: 55 %
Pre FEV6/FVC Ratio: 97 %
RV % pred: 137 %
RV: 2.79 L
TLC % PRED: 82 %
TLC: 5.26 L

## 2013-09-11 ENCOUNTER — Encounter: Payer: 59 | Attending: Physical Medicine & Rehabilitation | Admitting: Registered Nurse

## 2013-09-11 ENCOUNTER — Encounter: Payer: Self-pay | Admitting: Registered Nurse

## 2013-09-11 VITALS — BP 130/84 | HR 87 | Resp 14 | Ht 66.0 in | Wt 180.0 lb

## 2013-09-11 DIAGNOSIS — K219 Gastro-esophageal reflux disease without esophagitis: Secondary | ICD-10-CM | POA: Insufficient documentation

## 2013-09-11 DIAGNOSIS — G5780 Other specified mononeuropathies of unspecified lower limb: Secondary | ICD-10-CM

## 2013-09-11 DIAGNOSIS — M961 Postlaminectomy syndrome, not elsewhere classified: Secondary | ICD-10-CM | POA: Insufficient documentation

## 2013-09-11 DIAGNOSIS — J4489 Other specified chronic obstructive pulmonary disease: Secondary | ICD-10-CM | POA: Insufficient documentation

## 2013-09-11 DIAGNOSIS — G5771 Causalgia of right lower limb: Secondary | ICD-10-CM

## 2013-09-11 DIAGNOSIS — J449 Chronic obstructive pulmonary disease, unspecified: Secondary | ICD-10-CM | POA: Insufficient documentation

## 2013-09-11 DIAGNOSIS — Z79899 Other long term (current) drug therapy: Secondary | ICD-10-CM

## 2013-09-11 DIAGNOSIS — R209 Unspecified disturbances of skin sensation: Secondary | ICD-10-CM | POA: Insufficient documentation

## 2013-09-11 DIAGNOSIS — G5770 Causalgia of unspecified lower limb: Secondary | ICD-10-CM

## 2013-09-11 DIAGNOSIS — M79609 Pain in unspecified limb: Secondary | ICD-10-CM | POA: Insufficient documentation

## 2013-09-11 DIAGNOSIS — Z5181 Encounter for therapeutic drug level monitoring: Secondary | ICD-10-CM

## 2013-09-11 DIAGNOSIS — Z87891 Personal history of nicotine dependence: Secondary | ICD-10-CM | POA: Insufficient documentation

## 2013-09-11 MED ORDER — HYDROCODONE-ACETAMINOPHEN 5-325 MG PO TABS: 1.0000 | ORAL_TABLET | Freq: Two times a day (BID) | ORAL | Status: DC

## 2013-09-11 NOTE — Progress Notes (Signed)
Subjective:    Patient ID: Christopher Gould, male    DOB: 01/10/1956, 58 y.o.   MRN: [id number]  HPI: [name] is a 58 year old male who returns for follow up for chronic pain and medication refill. He says his pain is located in his right lower leg. He rates his pain 2. His current exercise regime is walking for short distances. He has been diagnosed with COPD by his Pulmonologist Dr. Luan Pulling.  Pain Inventory Average Pain 8 Pain Right Now 2 My pain is sharp, burning, stabbing, tingling and aching  In the last 24 hours, has pain interfered with the following? General activity 2 Relation with others 1 Enjoyment of life 1 What TIME of day is your pain at its worst? constant Sleep (in general) Fair  Pain is worse with: some activites Pain improves with: heat/ice Relief from Meds: 8  Mobility walk without assistance how many minutes can you walk? 20 ability to climb steps?  yes do you drive?  yes Do you have any goals in this area?  yes  Function disabled: date disabled 08/21/2013 Do you have any goals in this area?  yes  Neuro/Psych tingling  Prior Studies Any changes since last visit?  no  Physicians involved in your care Any changes since last visit?  no   Family History  Problem Relation Age of Onset  . Heart disease Mother    History   Social History  . Marital Status: Married    Spouse Name: N/A    Number of Children: N/A  . Years of Education: N/A   Social History Main Topics  . Smoking status: Former Smoker    Quit date: 04/06/2011  . Smokeless tobacco: Former Systems developer    Quit date: 10/29/1991  . Alcohol Use: No  . Drug Use: No  . Sexual Activity: None   Other Topics Concern  . None   Social History Narrative  . None   Past Surgical History  Procedure Laterality Date  . Spine surgery    . Cholecystectomy  1999  . Colonoscopy N/A 09/03/2012    Procedure: COLONOSCOPY;  Surgeon: Rogene Houston, MD;  [facility];   Service: Endoscopy;  Laterality: N/A;  1030-moved to 1125 Ann to notify pt   Past Medical History  Diagnosis Date  . COPD (chronic obstructive pulmonary disease)   . GERD (gastroesophageal reflux disease)   . Asthma    BP 130/84  Pulse 87  Resp 14  Ht 5\' 6"  (1.676 m)  Wt 180 lb (81.647 kg)  BMI 29.07 kg/m2  SpO2 95%  Opioid Risk Score:   Fall Risk Score: Low Fall Risk (0-5 points) (patient educated handout declined)   Review of Systems  Neurological:       Tingling  All other systems reviewed and are negative.      Objective:   Physical Exam  Nursing note and vitals reviewed. Constitutional: He is oriented to person, place, and time. He appears well-developed and well-nourished.  HENT:  Head: Normocephalic and atraumatic.  Neck: Normal range of motion. Neck supple.  Cardiovascular: Normal rate, regular rhythm and normal heart sounds.   Pulmonary/Chest: Effort normal and breath sounds normal.  Musculoskeletal:  Normal Muscle Bulk and Muscle Testing Reveals: Upper Extremities: Full ROM and Muscle Strength 5/5. Back with out spinal or paraspinal tenderness Lower Extremities: Left Leg Full ROM and Muscle Strength 5/5. Right Leg Flexion Produces Pain in lower leg. Arises From Chair witjh ease  Narrow Based Gait  Neurological: He is alert and oriented to person, place, and time. He has normal reflexes.  Skin: Skin is warm and dry.  Psychiatric: He has a normal mood and affect.          Assessment & Plan:  1. Right Saphenous Neuritis: Continue current medication regime. Refilled: HYDROcodone 5/325mg  one tablet by mouth BID #60

## 2013-09-25 ENCOUNTER — Ambulatory Visit (HOSPITAL_COMMUNITY)
Admission: RE | Admit: 2013-09-25 | Discharge: 2013-09-25 | Disposition: A | Discharge status: Home / Self Care | Payer: 59 | Source: Ambulatory Visit | Attending: Pulmonary Disease | Admitting: Pulmonary Disease

## 2013-09-25 DIAGNOSIS — I359 Nonrheumatic aortic valve disorder, unspecified: Secondary | ICD-10-CM

## 2013-09-25 DIAGNOSIS — J449 Chronic obstructive pulmonary disease, unspecified: Secondary | ICD-10-CM | POA: Insufficient documentation

## 2013-09-25 DIAGNOSIS — J4489 Other specified chronic obstructive pulmonary disease: Secondary | ICD-10-CM | POA: Insufficient documentation

## 2013-09-25 NOTE — Progress Notes (Signed)
  Echocardiogram 2D Echocardiogram has been performed.  [place], [place] 09/25/2013, 3:51 PM

## 2013-10-07 ENCOUNTER — Encounter: Payer: Self-pay | Admitting: Registered Nurse

## 2013-10-07 ENCOUNTER — Encounter: Payer: 59 | Attending: Physical Medicine & Rehabilitation | Admitting: Registered Nurse

## 2013-10-07 VITALS — BP 131/73 | HR 85 | Resp 14 | Ht 66.0 in | Wt 189.0 lb

## 2013-10-07 DIAGNOSIS — M79609 Pain in unspecified limb: Secondary | ICD-10-CM | POA: Diagnosis present

## 2013-10-07 DIAGNOSIS — K219 Gastro-esophageal reflux disease without esophagitis: Secondary | ICD-10-CM | POA: Diagnosis not present

## 2013-10-07 DIAGNOSIS — R209 Unspecified disturbances of skin sensation: Secondary | ICD-10-CM | POA: Insufficient documentation

## 2013-10-07 DIAGNOSIS — J4489 Other specified chronic obstructive pulmonary disease: Secondary | ICD-10-CM | POA: Insufficient documentation

## 2013-10-07 DIAGNOSIS — Z79899 Other long term (current) drug therapy: Secondary | ICD-10-CM

## 2013-10-07 DIAGNOSIS — Z87891 Personal history of nicotine dependence: Secondary | ICD-10-CM | POA: Diagnosis not present

## 2013-10-07 DIAGNOSIS — G5780 Other specified mononeuropathies of unspecified lower limb: Secondary | ICD-10-CM | POA: Diagnosis not present

## 2013-10-07 DIAGNOSIS — G5771 Causalgia of right lower limb: Secondary | ICD-10-CM

## 2013-10-07 DIAGNOSIS — Z5181 Encounter for therapeutic drug level monitoring: Secondary | ICD-10-CM

## 2013-10-07 DIAGNOSIS — M961 Postlaminectomy syndrome, not elsewhere classified: Secondary | ICD-10-CM | POA: Diagnosis not present

## 2013-10-07 DIAGNOSIS — J449 Chronic obstructive pulmonary disease, unspecified: Secondary | ICD-10-CM | POA: Insufficient documentation

## 2013-10-07 DIAGNOSIS — G5770 Causalgia of unspecified lower limb: Secondary | ICD-10-CM

## 2013-10-07 DIAGNOSIS — G5781 Other specified mononeuropathies of right lower limb: Secondary | ICD-10-CM

## 2013-10-07 MED ORDER — HYDROCODONE-ACETAMINOPHEN 5-325 MG PO TABS: 1.0000 | ORAL_TABLET | Freq: Two times a day (BID) | ORAL | Status: DC

## 2013-10-07 NOTE — Progress Notes (Signed)
Subjective:    Patient ID: Christopher Gould, male    DOB: May 20, 1955, 58 y.o.   MRN: [id number]  HPI:Christopher Gould is a 58 year old male who returns for follow up for chronic pain and medication refill. He says his pain is located in his right lower leg. He rates his pain 2. His current exercise regime is walking for short distances.    Pain Inventory Average Pain 9 Pain Right Now 2 My pain is sharp, burning, stabbing, tingling and aching  In the last 24 hours, has pain interfered with the following? General activity 2 Relation with others 1 Enjoyment of life 1 What TIME of day is your pain at its worst? constant all day Sleep (in general) Fair  Pain is worse with: walking, bending, sitting, inactivity, standing and some activites Pain improves with: heat/ice Relief from Meds: 8  Mobility walk without assistance how many minutes can you walk? 20 ability to climb steps?  yes do you drive?  yes transfers alone Do you have any goals in this area?  yes  Function disabled: date disabled na Do you have any goals in this area?  yes  Neuro/Psych No problems in this area  Prior Studies Any changes since last visit?  no  Physicians involved in your care Any changes since last visit?  no   Family History  Problem Relation Age of Onset  . Heart disease Mother    History   Social History  . Marital Status: Married    Spouse Name: N/A    Number of Children: N/A  . Years of Education: N/A   Social History Main Topics  . Smoking status: Former Smoker    Quit date: 04/06/2011  . Smokeless tobacco: Former Systems developer    Quit date: 10/29/1991  . Alcohol Use: No  . Drug Use: No  . Sexual Activity: None   Other Topics Concern  . None   Social History Narrative  . None   Past Surgical History  Procedure Laterality Date  . Spine surgery    . Cholecystectomy  1999  . Colonoscopy N/A 09/03/2012    Procedure: COLONOSCOPY;  Surgeon: Rogene Houston, MD;  [facility];  Service: Endoscopy;  Laterality: N/A;  1030-moved to 1125 Ann to notify pt   Past Medical History  Diagnosis Date  . COPD (chronic obstructive pulmonary disease)   . GERD (gastroesophageal reflux disease)   . Asthma    BP 131/73  Pulse 85  Resp 14  Ht 5\' 6"  (1.676 m)  Wt 189 lb (85.73 kg)  BMI 30.52 kg/m2  SpO2 94%  Opioid Risk Score:   Fall Risk Score: Low Fall Risk (0-5 points) (pt educated onf all risk, brochure given to pt previously)   Review of Systems  All other systems reviewed and are negative.      Objective:   Physical Exam  Nursing note and vitals reviewed. Constitutional: He is oriented to person, place, and time. He appears well-developed and well-nourished.  HENT:  Head: Normocephalic and atraumatic.  Neck: Normal range of motion. Neck supple.  Cardiovascular: Normal rate, regular rhythm and normal heart sounds.   Pulmonary/Chest: Effort normal and breath sounds normal.  Musculoskeletal:  Normal Muscle Bulk and Muscle Testing Reveals: Upper Extremities: Full ROM and Muscle Strength 5/5 Lower Extremities: Full ROM and Muscle Strength 5/5 Right Leg Flexion Produces Pain into Right Lower Leg Arises from chair with ease Narrow Based Gait  Neurological: He is  alert and oriented to person, place, and time.  Skin: Skin is warm and dry.  Psychiatric: He has a normal mood and affect.          Assessment & Plan:  1. Right Saphenous Neuritis: Continue current medication regime. Refilled: HYDROcodone 5/325mg  one tablet by mouth BID #60   20 minutes of face to face patient care time was spent during this visit. All questions were encouraged and answered.  F/U in 1 month

## 2013-11-06 ENCOUNTER — Encounter (HOSPITAL_BASED_OUTPATIENT_CLINIC_OR_DEPARTMENT_OTHER): Payer: 59 | Admitting: Registered Nurse

## 2013-11-06 ENCOUNTER — Encounter: Payer: Self-pay | Admitting: Registered Nurse

## 2013-11-06 VITALS — BP 127/86 | HR 86 | Resp 14 | Wt 192.6 lb

## 2013-11-06 DIAGNOSIS — G5781 Other specified mononeuropathies of right lower limb: Secondary | ICD-10-CM

## 2013-11-06 DIAGNOSIS — G5770 Causalgia of unspecified lower limb: Secondary | ICD-10-CM

## 2013-11-06 DIAGNOSIS — Z79899 Other long term (current) drug therapy: Secondary | ICD-10-CM

## 2013-11-06 DIAGNOSIS — Z5181 Encounter for therapeutic drug level monitoring: Secondary | ICD-10-CM

## 2013-11-06 DIAGNOSIS — G5780 Other specified mononeuropathies of unspecified lower limb: Secondary | ICD-10-CM

## 2013-11-06 DIAGNOSIS — M79609 Pain in unspecified limb: Secondary | ICD-10-CM | POA: Diagnosis not present

## 2013-11-06 DIAGNOSIS — G5771 Causalgia of right lower limb: Secondary | ICD-10-CM

## 2013-11-06 MED ORDER — HYDROCODONE-ACETAMINOPHEN 5-325 MG PO TABS: 1.0000 | ORAL_TABLET | Freq: Two times a day (BID) | ORAL | Status: DC

## 2013-11-06 NOTE — Progress Notes (Signed)
Subjective:    Patient ID: Christopher Gould, male    DOB: [birth year], 58 y.o.   MRN: [id number]  HPI: [name] is a 58 year old male who returns for follow up for chronic pain and medication refill. He says his pain is located in his right lower leg. He rates his pain 2. His current exercise regime is walking for short distances.  Pain Inventory Average Pain 9 Pain Right Now 2 My pain is intermittent  In the last 24 hours, has pain interfered with the following? General activity 1 Relation with others 1 Enjoyment of life 1 What TIME of day is your pain at its worst? varies Sleep (in general) Fair  Pain is worse with: walking, bending, sitting, inactivity, standing and some activites Pain improves with: heat/ice and medication Relief from Meds: 8  Mobility walk without assistance how many minutes can you walk? 20 ability to climb steps?  yes do you drive?  yes  Function disabled: date disabled 07/07/13 Do you have any goals in this area?  yes  Neuro/Psych numbness tingling  Prior Studies Any changes since last visit?  no  Physicians involved in your care Any changes since last visit?  no   Family History  Problem Relation Age of Onset  . Heart disease Mother    History   Social History  . Marital Status: Married    Spouse Name: N/A    Number of Children: N/A  . Years of Education: N/A   Social History Main Topics  . Smoking status: Former Smoker    Quit date: 04/06/2011  . Smokeless tobacco: Former Systems developer    Quit date: 10/29/1991  . Alcohol Use: No  . Drug Use: No  . Sexual Activity: None   Other Topics Concern  . None   Social History Narrative  . None   Past Surgical History  Procedure Laterality Date  . Spine surgery    . Cholecystectomy  1999  . Colonoscopy N/A 09/03/2012    Procedure: COLONOSCOPY;  Surgeon: Rogene Houston, MD;  Location: [facility];  Service: Endoscopy;  Laterality: N/A;  1030-moved to 1125 Ann to notify pt     Past Medical History  Diagnosis Date  . COPD (chronic obstructive pulmonary disease)   . GERD (gastroesophageal reflux disease)   . Asthma    BP 127/86  Pulse 86  Resp 14  Wt 192 lb 9.6 oz (87.363 kg)  SpO2 95%  Opioid Risk Score:   Fall Risk Score: Low Fall Risk (0-5 points) (previoulsy educated and given handout)  Review of Systems  Neurological: Positive for numbness.       Tingling  All other systems reviewed and are negative.      Objective:   Physical Exam  Nursing note and vitals reviewed. Constitutional: He is oriented to person, place, and time. He appears well-developed and well-nourished.  HENT:  Head: Normocephalic and atraumatic.  Neck: Normal range of motion. Neck supple.  Cardiovascular: Normal rate and regular rhythm.   Pulmonary/Chest: Effort normal and breath sounds normal.  Musculoskeletal:  Normal Muscle Bulk and Muscle testing reveals: Upper Extremities: Full ROM and Muscle Strength 5/5. Back without spinal or Paraspinal Tenderness Lower Extremities: Full ROM and Muscle Strength 5/5 Right Leg Flexion Produces pain into Right lower Leg Arises from chair with ease Narrow Based Gait  Neurological: He is alert and oriented to person, place, and time.  Skin: Skin is warm and dry.  Psychiatric: He has  a normal mood and affect.          Assessment & Plan:  1. Right Saphenous Neuritis: Continue current medication regime. Refilled: HYDROcodone 5/325mg  one tablet by mouth BID #60   15 minutes of face to face patient care time was spent during this visit. All questions were encouraged and answered.   F/U in 1 month

## 2013-11-11 ENCOUNTER — Ambulatory Visit: Payer: 59 | Admitting: Registered Nurse

## 2013-12-15 ENCOUNTER — Encounter: Payer: Self-pay | Admitting: Physical Medicine & Rehabilitation

## 2013-12-15 ENCOUNTER — Ambulatory Visit (HOSPITAL_BASED_OUTPATIENT_CLINIC_OR_DEPARTMENT_OTHER): Payer: 59 | Admitting: Physical Medicine & Rehabilitation

## 2013-12-15 ENCOUNTER — Encounter: Payer: 59 | Attending: Physical Medicine & Rehabilitation

## 2013-12-15 VITALS — BP 125/83 | HR 77 | Resp 14 | Wt 189.0 lb

## 2013-12-15 DIAGNOSIS — Z87891 Personal history of nicotine dependence: Secondary | ICD-10-CM | POA: Diagnosis not present

## 2013-12-15 DIAGNOSIS — K219 Gastro-esophageal reflux disease without esophagitis: Secondary | ICD-10-CM | POA: Diagnosis not present

## 2013-12-15 DIAGNOSIS — G5781 Other specified mononeuropathies of right lower limb: Secondary | ICD-10-CM

## 2013-12-15 DIAGNOSIS — G5780 Other specified mononeuropathies of unspecified lower limb: Secondary | ICD-10-CM

## 2013-12-15 DIAGNOSIS — J4489 Other specified chronic obstructive pulmonary disease: Secondary | ICD-10-CM | POA: Insufficient documentation

## 2013-12-15 DIAGNOSIS — M961 Postlaminectomy syndrome, not elsewhere classified: Secondary | ICD-10-CM | POA: Diagnosis not present

## 2013-12-15 DIAGNOSIS — M79609 Pain in unspecified limb: Secondary | ICD-10-CM | POA: Insufficient documentation

## 2013-12-15 DIAGNOSIS — J449 Chronic obstructive pulmonary disease, unspecified: Secondary | ICD-10-CM | POA: Insufficient documentation

## 2013-12-15 DIAGNOSIS — R209 Unspecified disturbances of skin sensation: Secondary | ICD-10-CM | POA: Insufficient documentation

## 2013-12-15 DIAGNOSIS — G5771 Causalgia of right lower limb: Secondary | ICD-10-CM

## 2013-12-15 DIAGNOSIS — G5770 Causalgia of unspecified lower limb: Secondary | ICD-10-CM

## 2013-12-15 MED ORDER — HYDROCODONE-ACETAMINOPHEN 5-325 MG PO TABS: 1.0000 | ORAL_TABLET | Freq: Two times a day (BID) | ORAL | Status: DC

## 2013-12-15 NOTE — Progress Notes (Signed)
   Subjective:    Patient ID: Christopher Gould, male    DOB: 07-27-1955, 58 y.o.   MRN: [id number]  HPI 58 year old male  has had femoral neuropathy following back surgery. Mainly has sensory dysesthesia is in the right saphenous nerve distribution. He's responded well to right saphenous nerve blocks performed under nerve stimulator guidance. Last injection performed 07/14/2013, and these injections are done possibly every 6 months. Patient feels like the current injection is starting where off.  Diagnosed with COPD, has QID nebulizers Gained 40lb since pt became disabled    Pain Inventory Average Pain 8 Pain Right Now 2 My pain is burning, stabbing, tingling and aching  In the last 24 hours, has pain interfered with the following? General activity 2 Relation with others 1 Enjoyment of life 1 What TIME of day is your pain at its worst? all Sleep (in general) Fair  Pain is worse with: walking, bending, sitting, inactivity, standing and some activites Pain improves with: heat/ice and medication Relief from Meds: 9  Mobility walk with assistance how many minutes can you walk? 20 ability to climb steps?  yes do you drive?  yes  Function disabled: date disabled .  Neuro/Psych tingling  Prior Studies Any changes since last visit?  no  Physicians involved in your care Any changes since last visit?  no   Family History  Problem Relation Age of Onset  . Heart disease Mother    History   Social History  . Marital Status: Married    Spouse Name: N/A    Number of Children: N/A  . Years of Education: N/A   Social History Main Topics  . Smoking status: Former Smoker    Quit date: 04/06/2011  . Smokeless tobacco: Former Systems developer    Quit date: 10/29/1991  . Alcohol Use: No  . Drug Use: No  . Sexual Activity: None   Other Topics Concern  . None   Social History Narrative  . None   Past Surgical History  Procedure Laterality Date  . Spine surgery    .  Cholecystectomy  1999  . Colonoscopy N/A 09/03/2012    Procedure: COLONOSCOPY;  Surgeon: Rogene Houston, MD;  Location: [facility];  Service: Endoscopy;  Laterality: N/A;  1030-moved to 1125 Ann to notify pt   Past Medical History  Diagnosis Date  . COPD (chronic obstructive pulmonary disease)   . GERD (gastroesophageal reflux disease)   . Asthma    BP 125/83  Pulse 77  Resp 14  Wt 189 lb (85.73 kg)  SpO2 96%  Opioid Risk Score:   Fall Risk Score: Low Fall Risk (0-5 points) (previoulsy educated and given handout) Review of Systems  Neurological:       Tingling  All other systems reviewed and are negative.      Objective:   Physical Exam  Hyperalgesia to the medial leg and medial malleoli are at Hypersensitivity at that same area  No hypersensitivity or hyperalgesia on the lateral aspect or the dorsal aspect of the foot. None on the plantar aspect  No evidence of muscle wasting in the foot or leg. Normal ankle range of motion      Assessment & Plan:  1. 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

## 2014-01-19 ENCOUNTER — Encounter: Payer: Self-pay | Admitting: Physical Medicine & Rehabilitation

## 2014-01-19 ENCOUNTER — Encounter: Payer: 59 | Attending: Physical Medicine & Rehabilitation

## 2014-01-19 ENCOUNTER — Ambulatory Visit (HOSPITAL_BASED_OUTPATIENT_CLINIC_OR_DEPARTMENT_OTHER): Payer: 59 | Admitting: Physical Medicine & Rehabilitation

## 2014-01-19 VITALS — BP 124/82 | HR 85 | Resp 14 | Ht 66.0 in | Wt 193.0 lb

## 2014-01-19 DIAGNOSIS — Z5181 Encounter for therapeutic drug level monitoring: Secondary | ICD-10-CM | POA: Insufficient documentation

## 2014-01-19 DIAGNOSIS — G5771 Causalgia of right lower limb: Secondary | ICD-10-CM | POA: Diagnosis present

## 2014-01-19 DIAGNOSIS — Z79899 Other long term (current) drug therapy: Secondary | ICD-10-CM | POA: Diagnosis not present

## 2014-01-19 MED ORDER — HYDROCODONE-ACETAMINOPHEN 5-325 MG PO TABS: 1.0000 | ORAL_TABLET | Freq: Two times a day (BID) | ORAL | Status: DC

## 2014-01-19 NOTE — Progress Notes (Signed)
PROCEDURE: Right saphenous nerve block.  INDICATION: Saphenous neuritis with pain only partially response to  medication management including narcotic analgesics. Last injection was  performed in October 2011. Informed consent was obtained after  describing the risks and benefits of the procedure. These include  bleeding, bruising, infection. He elects to proceed and has given  written consent. EMG stimulator utilized, a right medial tibial flare.  Paresthesias reproduced to the ankle area, area was marked, prepped with  chlorhexidine, entered with 22-gauge 40-mm needle electrode under E-stem  guidance. Medial paresthesias reproduced, 1 mL of 40 mg/mL Depo-Medrol  and 4 mL .25% marcaine.  

## 2014-01-20 NOTE — Addendum Note (Signed)
Addended by: Valeria Batman on: 01/20/2014 08:22 AM   Modules accepted: Orders

## 2014-02-15 ENCOUNTER — Encounter: Payer: 59 | Attending: Physical Medicine & Rehabilitation | Admitting: Registered Nurse

## 2014-02-15 ENCOUNTER — Encounter: Payer: Self-pay | Admitting: Registered Nurse

## 2014-02-15 VITALS — BP 121/85 | HR 90 | Resp 14 | Ht 66.0 in | Wt 196.0 lb

## 2014-02-15 DIAGNOSIS — J45909 Unspecified asthma, uncomplicated: Secondary | ICD-10-CM | POA: Diagnosis not present

## 2014-02-15 DIAGNOSIS — Z5181 Encounter for therapeutic drug level monitoring: Secondary | ICD-10-CM

## 2014-02-15 DIAGNOSIS — Z760 Encounter for issue of repeat prescription: Secondary | ICD-10-CM | POA: Diagnosis not present

## 2014-02-15 DIAGNOSIS — J449 Chronic obstructive pulmonary disease, unspecified: Secondary | ICD-10-CM | POA: Diagnosis not present

## 2014-02-15 DIAGNOSIS — G8929 Other chronic pain: Secondary | ICD-10-CM | POA: Insufficient documentation

## 2014-02-15 DIAGNOSIS — G5781 Other specified mononeuropathies of right lower limb: Secondary | ICD-10-CM

## 2014-02-15 DIAGNOSIS — G588 Other specified mononeuropathies: Secondary | ICD-10-CM | POA: Insufficient documentation

## 2014-02-15 DIAGNOSIS — Z79899 Other long term (current) drug therapy: Secondary | ICD-10-CM

## 2014-02-15 DIAGNOSIS — Z87891 Personal history of nicotine dependence: Secondary | ICD-10-CM | POA: Diagnosis not present

## 2014-02-15 DIAGNOSIS — G5771 Causalgia of right lower limb: Secondary | ICD-10-CM

## 2014-02-15 DIAGNOSIS — K219 Gastro-esophageal reflux disease without esophagitis: Secondary | ICD-10-CM | POA: Insufficient documentation

## 2014-02-15 MED ORDER — HYDROCODONE-ACETAMINOPHEN 5-325 MG PO TABS: 1.0000 | ORAL_TABLET | Freq: Two times a day (BID) | ORAL | Status: DC

## 2014-02-15 NOTE — Progress Notes (Signed)
Subjective:    Patient ID: Christopher Gould, male    DOB: 16-Nov-1955, 58 y.o.   MRN: [id number]  HPI: [name] is a 58 year old male who returns for follow up for chronic pain and medication refill. He says his pain is located in his right lower leg. He rates his pain 4. His current exercise regime is walking for short distances. S/P Right Saphenous Nerve Block with good relief noted.   Pain Inventory Average Pain 9 Pain Right Now 4 My pain is sharp, burning, stabbing, tingling and aching  In the last 24 hours, has pain interfered with the following? General activity 1 Relation with others 1 Enjoyment of life 2 What TIME of day is your pain at its worst? morning, daytime, evening night Sleep (in general) Fair  Pain is worse with: walking, bending, sitting, standing and some activites Pain improves with: heat/ice and medication Relief from Meds: 8  Mobility walk without assistance how many minutes can you walk? 15 ability to climb steps?  yes do you drive?  yes Do you have any goals in this area?  yes  Function disabled: date disabled .  Neuro/Psych No problems in this area  Prior Studies Any changes since last visit?  no  Physicians involved in your care Any changes since last visit?  no   Family History  Problem Relation Age of Onset  . Heart disease Mother    History   Social History  . Marital Status: Married    Spouse Name: N/A    Number of Children: N/A  . Years of Education: N/A   Social History Main Topics  . Smoking status: Former Smoker    Quit date: 04/06/2011  . Smokeless tobacco: Former Systems developer    Quit date: 10/29/1991  . Alcohol Use: No  . Drug Use: No  . Sexual Activity: None   Other Topics Concern  . None   Social History Narrative   Past Surgical History  Procedure Laterality Date  . Spine surgery    . Cholecystectomy  1999  . Colonoscopy N/A 09/03/2012    Procedure: COLONOSCOPY;  Surgeon: Rogene Houston, MD;   Location: [facility];  Service: Endoscopy;  Laterality: N/A;  1030-moved to 1125 Ann to notify pt   Past Medical History  Diagnosis Date  . COPD (chronic obstructive pulmonary disease)   . GERD (gastroesophageal reflux disease)   . Asthma    BP 121/85 mmHg  Pulse 90  Resp 14  Ht 5\' 6"  (1.676 m)  Wt 196 lb (88.905 kg)  BMI 31.65 kg/m2  SpO2 96%  Opioid Risk Score:   Fall Risk Score: Low Fall Risk (0-5 points) Review of Systems  All other systems reviewed and are negative.      Objective:   Physical Exam  Constitutional: He is oriented to person, place, and time. He appears well-developed and well-nourished.  HENT:  Head: Normocephalic and atraumatic.  Neck: Normal range of motion. Neck supple.  Cardiovascular: Normal rate and regular rhythm.   Pulmonary/Chest: Effort normal and breath sounds normal.  Musculoskeletal:  Normal Muscle Bulk and Muscle testing Reveals: Upper Extremities: Full ROM and Muscle Strength 5/5 Lower Extremities: Full ROM and Muscle strength 5/5 Arises from chair with ease Narrow Based Gait  Neurological: He is alert and oriented to person, place, and time.  Skin: Skin is warm and dry.  Psychiatric: He has a normal mood and affect.  Nursing note and vitals reviewed.  Assessment & Plan:  1. Right Saphenous Neuritis: S/P Right Saphenous Nerve Block with good relief noted:  Continue current medication regime. Refilled: HYDROcodone 5/325mg  one tablet by mouth BID #60   15 minutes of face to face patient care time was spent during this visit. All questions were encouraged and answered.   F/U in 1 month

## 2014-02-16 ENCOUNTER — Ambulatory Visit: Payer: 59 | Admitting: Registered Nurse

## 2014-03-18 ENCOUNTER — Encounter: Payer: Self-pay | Admitting: Registered Nurse

## 2014-03-18 ENCOUNTER — Encounter: Payer: 59 | Attending: Physical Medicine & Rehabilitation | Admitting: Registered Nurse

## 2014-03-18 VITALS — BP 131/79 | HR 83 | Resp 14 | Ht 66.0 in | Wt 198.0 lb

## 2014-03-18 DIAGNOSIS — K219 Gastro-esophageal reflux disease without esophagitis: Secondary | ICD-10-CM | POA: Diagnosis not present

## 2014-03-18 DIAGNOSIS — G8929 Other chronic pain: Secondary | ICD-10-CM | POA: Insufficient documentation

## 2014-03-18 DIAGNOSIS — G588 Other specified mononeuropathies: Secondary | ICD-10-CM | POA: Diagnosis not present

## 2014-03-18 DIAGNOSIS — Z760 Encounter for issue of repeat prescription: Secondary | ICD-10-CM | POA: Diagnosis not present

## 2014-03-18 DIAGNOSIS — J45909 Unspecified asthma, uncomplicated: Secondary | ICD-10-CM | POA: Diagnosis not present

## 2014-03-18 DIAGNOSIS — Z87891 Personal history of nicotine dependence: Secondary | ICD-10-CM | POA: Insufficient documentation

## 2014-03-18 DIAGNOSIS — Z5181 Encounter for therapeutic drug level monitoring: Secondary | ICD-10-CM

## 2014-03-18 DIAGNOSIS — J449 Chronic obstructive pulmonary disease, unspecified: Secondary | ICD-10-CM | POA: Insufficient documentation

## 2014-03-18 DIAGNOSIS — G5771 Causalgia of right lower limb: Secondary | ICD-10-CM

## 2014-03-18 DIAGNOSIS — G5781 Other specified mononeuropathies of right lower limb: Secondary | ICD-10-CM

## 2014-03-18 DIAGNOSIS — Z79899 Other long term (current) drug therapy: Secondary | ICD-10-CM

## 2014-03-18 MED ORDER — HYDROCODONE-ACETAMINOPHEN 5-325 MG PO TABS: 1.0000 | ORAL_TABLET | Freq: Two times a day (BID) | ORAL | Status: DC

## 2014-03-18 NOTE — Progress Notes (Signed)
Subjective:    Patient ID: Christopher Gould, male    DOB: 1956-01-05, 58 y.o.   MRN: [id number]  HPI: [name] is a 58 year old male who returns for follow up for chronic pain and medication refill. He says his pain is located in his right lower leg. He rates his pain 4. His current exercise regime is walking for short distances  Pain Inventory Average Pain 7 Pain Right Now 3 My pain is constant  In the last 24 hours, has pain interfered with the following? General activity 2 Relation with others 1 Enjoyment of life 2 What TIME of day is your pain at its worst? all Sleep (in general) Fair  Pain is worse with: walking, bending, standing and some activites Pain improves with: heat/ice and medication Relief from Meds: 9  Mobility walk without assistance how many minutes can you walk? 20 ability to climb steps?  yes do you drive?  yes Do you have any goals in this area?  yes  Function disabled: date disabled . Do you have any goals in this area?  yes  Neuro/Psych No problems in this area  Prior Studies Any changes since last visit?  no  Physicians involved in your care Any changes since last visit?  no   Family History  Problem Relation Age of Onset  . Heart disease Mother    History   Social History  . Marital Status: Married    Spouse Name: N/A    Number of Children: N/A  . Years of Education: N/A   Social History Main Topics  . Smoking status: Former Smoker    Quit date: 04/06/2011  . Smokeless tobacco: Former Systems developer    Quit date: 10/29/1991  . Alcohol Use: No  . Drug Use: No  . Sexual Activity: None   Other Topics Concern  . None   Social History Narrative   Past Surgical History  Procedure Laterality Date  . Spine surgery    . Cholecystectomy  1999  . Colonoscopy N/A 09/03/2012    Procedure: COLONOSCOPY;  Surgeon: Rogene Houston, MD;  Location: [facility];  Service: Endoscopy;  Laterality: N/A;  1030-moved to 1125 Ann to notify  pt   Past Medical History  Diagnosis Date  . COPD (chronic obstructive pulmonary disease)   . GERD (gastroesophageal reflux disease)   . Asthma    BP 131/79 mmHg  Pulse 83  Resp 14  Ht 5\' 6"  (1.676 m)  Wt 198 lb (89.812 kg)  BMI 31.97 kg/m2  SpO2 94%  Opioid Risk Score:   Fall Risk Score: Low Fall Risk (0-5 points) (pt declined pamphlet) Review of Systems  All other systems reviewed and are negative.      Objective:   Physical Exam  Constitutional: He is oriented to person, place, and time. He appears well-developed and well-nourished.  HENT:  Head: Normocephalic and atraumatic.  Neck: Normal range of motion. Neck supple.  Cardiovascular: Normal rate and regular rhythm.   Pulmonary/Chest: Effort normal and breath sounds normal.  Musculoskeletal:  Normal Muscle bulk and Muscle testing Reveals: Upper Extremities: Full ROM and Muscle strength 5/5 Lower Extremities: Full ROM and Muscle strength 5/5 Arises from chair with ease Narrow based gait  Neurological: He is alert and oriented to person, place, and time.  Skin: Skin is warm and dry.  Psychiatric: He has a normal mood and affect.  Nursing note and vitals reviewed.  Assessment & Plan:  1. Right Saphenous Neuritis: Remains with good relief from Right Saphenous Nerve Block. Continue current medication regime. Refilled: HYDROcodone 5/325mg  one tablet by mouth BID #60   15 minutes of face to face patient care time was spent during this visit. All questions were encouraged and answered.   F/U in 1 month

## 2014-04-16 ENCOUNTER — Encounter: Payer: 59 | Attending: Physical Medicine & Rehabilitation | Admitting: Registered Nurse

## 2014-04-16 ENCOUNTER — Encounter: Payer: Self-pay | Admitting: Registered Nurse

## 2014-04-16 VITALS — BP 132/72 | HR 87 | Resp 14

## 2014-04-16 DIAGNOSIS — J449 Chronic obstructive pulmonary disease, unspecified: Secondary | ICD-10-CM | POA: Insufficient documentation

## 2014-04-16 DIAGNOSIS — J45909 Unspecified asthma, uncomplicated: Secondary | ICD-10-CM | POA: Diagnosis not present

## 2014-04-16 DIAGNOSIS — G5771 Causalgia of right lower limb: Secondary | ICD-10-CM

## 2014-04-16 DIAGNOSIS — Z79899 Other long term (current) drug therapy: Secondary | ICD-10-CM

## 2014-04-16 DIAGNOSIS — Z5181 Encounter for therapeutic drug level monitoring: Secondary | ICD-10-CM

## 2014-04-16 DIAGNOSIS — G588 Other specified mononeuropathies: Secondary | ICD-10-CM | POA: Diagnosis not present

## 2014-04-16 DIAGNOSIS — Z760 Encounter for issue of repeat prescription: Secondary | ICD-10-CM | POA: Diagnosis not present

## 2014-04-16 DIAGNOSIS — Z87891 Personal history of nicotine dependence: Secondary | ICD-10-CM | POA: Diagnosis not present

## 2014-04-16 DIAGNOSIS — G8929 Other chronic pain: Secondary | ICD-10-CM | POA: Insufficient documentation

## 2014-04-16 DIAGNOSIS — G5781 Other specified mononeuropathies of right lower limb: Secondary | ICD-10-CM

## 2014-04-16 DIAGNOSIS — K219 Gastro-esophageal reflux disease without esophagitis: Secondary | ICD-10-CM | POA: Diagnosis not present

## 2014-04-16 MED ORDER — HYDROCODONE-ACETAMINOPHEN 5-325 MG PO TABS: 1.0000 | ORAL_TABLET | Freq: Two times a day (BID) | ORAL | Status: DC

## 2014-04-16 NOTE — Progress Notes (Signed)
Subjective:    Patient ID: Christopher Gould, male    DOB: 19-Apr-1955, 59 y.o.   MRN: [id number]  HPI: [name] is a 59 year old male who returns for follow up for chronic pain and medication refill. He says his pain is located in his right lower leg. He rates his pain 3. His current exercise regime is walking for short distances and performing stretching exercises.   Pain Inventory Average Pain 9 Pain Right Now 3 My pain is constant, sharp, burning, stabbing and tingling  In the last 24 hours, has pain interfered with the following? General activity 2 Relation with others 1 Enjoyment of life 2 What TIME of day is your pain at its worst? . Sleep (in general) Fair  Pain is worse with: walking, bending, sitting, inactivity and standing Pain improves with: heat/ice and medication Relief from Meds: 8  Mobility walk without assistance how many minutes can you walk? 15 ability to climb steps?  yes do you drive?  yes Do you have any goals in this area?  yes  Function not employed: date last employed 06/30/2013 disabled: date disabled 07/01/2013 Do you have any goals in this area?  no  Neuro/Psych numbness tingling  Prior Studies Any changes since last visit?  no  Physicians involved in your care Any changes since last visit?  no   Family History  Problem Relation Age of Onset  . Heart disease Mother    History   Social History  . Marital Status: Married    Spouse Name: N/A    Number of Children: N/A  . Years of Education: N/A   Social History Main Topics  . Smoking status: Former Smoker    Quit date: 04/06/2011  . Smokeless tobacco: Former Systems developer    Quit date: 10/29/1991  . Alcohol Use: No  . Drug Use: No  . Sexual Activity: None   Other Topics Concern  . None   Social History Narrative   Past Surgical History  Procedure Laterality Date  . Spine surgery    . Cholecystectomy  1999  . Colonoscopy N/A 09/03/2012    Procedure: COLONOSCOPY;   Surgeon: Rogene Houston, MD;  Location: [facility];  Service: Endoscopy;  Laterality: N/A;  1030-moved to 1125 Ann to notify pt   Past Medical History  Diagnosis Date  . COPD (chronic obstructive pulmonary disease)   . GERD (gastroesophageal reflux disease)   . Asthma    BP 132/72 mmHg  Pulse 87  Resp 14  SpO2 95%  Opioid Risk Score:   Fall Risk Score: Low Fall Risk (0-5 points) Review of Systems  Neurological: Positive for numbness.       Tingling  All other systems reviewed and are negative.      Objective:   Physical Exam  Constitutional: He is oriented to person, place, and time. He appears well-developed and well-nourished.  HENT:  Head: Normocephalic and atraumatic.  Neck: Normal range of motion. Neck supple.  Cardiovascular: Normal rate and regular rhythm.   Pulmonary/Chest: Effort normal and breath sounds normal.  Musculoskeletal:  Normal Muscle Bulk and Muscle Testing Reveals: Upper Extremities: Full ROM and Muscle Strength 5/5 Lower Extremities: Full ROM and Muscle strength 5/5 Arises from chair with ease Narrow based gait  Neurological: He is alert and oriented to person, place, and time.  Skin: Skin is warm and dry.  Psychiatric: He has a normal mood and affect.  Nursing note and vitals reviewed.  Assessment & Plan:  1. Right Saphenous Neuritis: Remains with good relief from Right Saphenous Nerve Block. Continue current medication regime. Refilled: HYDROcodone 5/325mg  one tablet by mouth BID #60   15 minutes of face to face patient care time was spent during this visit. All questions were encouraged and answered.   F/U in 1 month

## 2014-05-19 ENCOUNTER — Encounter: Payer: 59 | Attending: Physical Medicine & Rehabilitation | Admitting: Registered Nurse

## 2014-05-19 ENCOUNTER — Encounter: Payer: Self-pay | Admitting: Registered Nurse

## 2014-05-19 VITALS — BP 124/86 | HR 84 | Resp 14

## 2014-05-19 DIAGNOSIS — G5771 Causalgia of right lower limb: Secondary | ICD-10-CM

## 2014-05-19 DIAGNOSIS — G588 Other specified mononeuropathies: Secondary | ICD-10-CM | POA: Insufficient documentation

## 2014-05-19 DIAGNOSIS — Z79899 Other long term (current) drug therapy: Secondary | ICD-10-CM

## 2014-05-19 DIAGNOSIS — K219 Gastro-esophageal reflux disease without esophagitis: Secondary | ICD-10-CM | POA: Insufficient documentation

## 2014-05-19 DIAGNOSIS — G5781 Other specified mononeuropathies of right lower limb: Secondary | ICD-10-CM

## 2014-05-19 DIAGNOSIS — Z87891 Personal history of nicotine dependence: Secondary | ICD-10-CM | POA: Diagnosis not present

## 2014-05-19 DIAGNOSIS — Z760 Encounter for issue of repeat prescription: Secondary | ICD-10-CM | POA: Diagnosis not present

## 2014-05-19 DIAGNOSIS — G8929 Other chronic pain: Secondary | ICD-10-CM | POA: Diagnosis present

## 2014-05-19 DIAGNOSIS — Z5181 Encounter for therapeutic drug level monitoring: Secondary | ICD-10-CM

## 2014-05-19 DIAGNOSIS — J449 Chronic obstructive pulmonary disease, unspecified: Secondary | ICD-10-CM | POA: Insufficient documentation

## 2014-05-19 DIAGNOSIS — J45909 Unspecified asthma, uncomplicated: Secondary | ICD-10-CM | POA: Insufficient documentation

## 2014-05-19 MED ORDER — HYDROCODONE-ACETAMINOPHEN 5-325 MG PO TABS: 1.0000 | ORAL_TABLET | Freq: Two times a day (BID) | ORAL | Status: DC

## 2014-05-19 NOTE — Progress Notes (Signed)
Subjective:    Patient ID: Christopher Gould, male    DOB: 04/30/1955, 59 y.o.   MRN: [id number]  HPI: [name] is a 59 year old male who returns for follow up for chronic pain and medication refill. He says his pain is located in his right lower leg. He rates his pain 3. His current exercise regime is walking for short distances and performing stretching exercises.  Pain Inventory Average Pain 8 Pain Right Now 3 My pain is constant, burning, stabbing and tingling  In the last 24 hours, has pain interfered with the following? General activity 2 Relation with others 1 Enjoyment of life 2 What TIME of day is your pain at its worst? VARIES Sleep (in general) Fair  Pain is worse with: walking, bending, sitting, standing and some activites Pain improves with: heat/ice and medication Relief from Meds: 8  Mobility walk without assistance how many minutes can you walk? 15-20 ability to climb steps?  yes do you drive?  yes  Function disabled: date disabled .  Neuro/Psych numbness tingling  Prior Studies Any changes since last visit?  no  Physicians involved in your care Any changes since last visit?  no   Family History  Problem Relation Age of Onset  . Heart disease Mother    History   Social History  . Marital Status: Married    Spouse Name: N/A  . Number of Children: N/A  . Years of Education: N/A   Social History Main Topics  . Smoking status: Former Smoker    Quit date: 04/06/2011  . Smokeless tobacco: Former Systems developer    Quit date: 10/29/1991  . Alcohol Use: No  . Drug Use: No  . Sexual Activity: Not on file   Other Topics Concern  . None   Social History Narrative   Past Surgical History  Procedure Laterality Date  . Spine surgery    . Cholecystectomy  1999  . Colonoscopy N/A 09/03/2012    Procedure: COLONOSCOPY;  Surgeon: Rogene Houston, MD;  Location: [facility];  Service: Endoscopy;  Laterality: N/A;  1030-moved to 1125 Ann to notify  pt   Past Medical History  Diagnosis Date  . COPD (chronic obstructive pulmonary disease)   . GERD (gastroesophageal reflux disease)   . Asthma    BP 124/86 mmHg  Pulse 84  Resp 14  SpO2 96%  Opioid Risk Score:   Fall Risk Score: Low Fall Risk (0-5 points)  Review of Systems  Constitutional: Negative.   HENT: Negative.   Eyes: Negative.   Respiratory: Negative.   Cardiovascular: Negative.   Gastrointestinal: Negative.   Endocrine: Negative.   Genitourinary: Negative.   Musculoskeletal:       Right leg pain  Skin: Negative.   Allergic/Immunologic: Negative.   Neurological: Positive for numbness.       Tingling  Hematological: Negative.   Psychiatric/Behavioral: Negative.        Objective:   Physical Exam  Constitutional: He is oriented to person, place, and time. He appears well-developed and well-nourished.  HENT:  Head: Normocephalic and atraumatic.  Neck: Normal range of motion. Neck supple.  Cardiovascular: Normal rate and regular rhythm.   Pulmonary/Chest: Effort normal and breath sounds normal.  Musculoskeletal:  Normal Muscle Bulk and Muscle Testing Reveals: Upper Extremities: Full ROM and Muscle strength 5/5 Lower extremities: Full ROM and Muscle strength 5/5 Arises from chair with ease Narrow Based Gait  Neurological: He is alert and oriented to person,  place, and time.  Skin: Skin is warm and dry.  Psychiatric: He has a normal mood and affect.  Nursing note and vitals reviewed.         Assessment & Plan:  1. Right Saphenous Neuritis: Remains with good relief from Right Saphenous Nerve Block. Continue current medication regime. Refilled: HYDROcodone 5/325mg  one tablet by mouth BID #60   15 minutes of face to face patient care time was spent during this visit. All questions were encouraged and answered.   F/U in 1 month

## 2014-06-17 ENCOUNTER — Encounter: Payer: Self-pay | Admitting: Registered Nurse

## 2014-06-17 ENCOUNTER — Encounter: Payer: 59 | Attending: Physical Medicine & Rehabilitation | Admitting: Registered Nurse

## 2014-06-17 ENCOUNTER — Other Ambulatory Visit: Payer: Self-pay | Admitting: Physical Medicine & Rehabilitation

## 2014-06-17 VITALS — BP 123/87 | HR 85 | Resp 14

## 2014-06-17 DIAGNOSIS — J45909 Unspecified asthma, uncomplicated: Secondary | ICD-10-CM | POA: Insufficient documentation

## 2014-06-17 DIAGNOSIS — G5771 Causalgia of right lower limb: Secondary | ICD-10-CM

## 2014-06-17 DIAGNOSIS — Z87891 Personal history of nicotine dependence: Secondary | ICD-10-CM | POA: Diagnosis not present

## 2014-06-17 DIAGNOSIS — Z760 Encounter for issue of repeat prescription: Secondary | ICD-10-CM | POA: Insufficient documentation

## 2014-06-17 DIAGNOSIS — G588 Other specified mononeuropathies: Secondary | ICD-10-CM | POA: Insufficient documentation

## 2014-06-17 DIAGNOSIS — K219 Gastro-esophageal reflux disease without esophagitis: Secondary | ICD-10-CM | POA: Diagnosis not present

## 2014-06-17 DIAGNOSIS — G5781 Other specified mononeuropathies of right lower limb: Secondary | ICD-10-CM

## 2014-06-17 DIAGNOSIS — G8929 Other chronic pain: Secondary | ICD-10-CM | POA: Insufficient documentation

## 2014-06-17 DIAGNOSIS — J449 Chronic obstructive pulmonary disease, unspecified: Secondary | ICD-10-CM | POA: Diagnosis not present

## 2014-06-17 DIAGNOSIS — Z79899 Other long term (current) drug therapy: Secondary | ICD-10-CM

## 2014-06-17 DIAGNOSIS — Z5181 Encounter for therapeutic drug level monitoring: Secondary | ICD-10-CM

## 2014-06-17 MED ORDER — HYDROCODONE-ACETAMINOPHEN 5-325 MG PO TABS: 1.0000 | ORAL_TABLET | Freq: Two times a day (BID) | ORAL | Status: DC

## 2014-06-17 NOTE — Progress Notes (Signed)
Subjective:    Patient ID: Christopher Gould, male    DOB: 1955-12-01, 59 y.o.   MRN: [id number]  HPI: Christopher Gould is a 59 year old male who returns for follow up for chronic pain and medication refill. He says his pain is located in his right lower leg. He rates his pain 3. His current exercise regime is walking for short distances and performing stretching exercises.  Pain Inventory Average Pain 8 Pain Right Now 3 My pain is sharp, burning, stabbing, tingling and aching  In the last 24 hours, has pain interfered with the following? General activity 2 Relation with others 1 Enjoyment of life 3 What TIME of day is your pain at its worst? all Sleep (in general) Fair  Pain is worse with: walking, bending, sitting, inactivity, standing and some activites Pain improves with: heat/ice and medication Relief from Meds: 8  Mobility walk without assistance how many minutes can you walk? 15-20 ability to climb steps?  yes do you drive?  yes Do you have any goals in this area?  yes  Function disabled: date disabled . Do you have any goals in this area?  yes  Neuro/Psych tingling  Prior Studies Any changes since last visit?  no  Physicians involved in your care Any changes since last visit?  no   Family History  Problem Relation Age of Onset  . Heart disease Mother    History   Social History  . Marital Status: Married    Spouse Name: N/A  . Number of Children: N/A  . Years of Education: N/A   Social History Main Topics  . Smoking status: Former Smoker    Quit date: 04/06/2011  . Smokeless tobacco: Former Systems developer    Quit date: 10/29/1991  . Alcohol Use: No  . Drug Use: No  . Sexual Activity: Not on file   Other Topics Concern  . None   Social History Narrative   Past Surgical History  Procedure Laterality Date  . Spine surgery    . Cholecystectomy  1999  . Colonoscopy N/A 09/03/2012    Procedure: COLONOSCOPY;  Surgeon: Rogene Houston, MD;  [facility];  Service: Endoscopy;  Laterality: N/A;  1030-moved to 1125 Ann to notify pt   Past Medical History  Diagnosis Date  . COPD (chronic obstructive pulmonary disease)   . GERD (gastroesophageal reflux disease)   . Asthma    There were no vitals taken for this visit.  Opioid Risk Score:   Fall Risk Score: Low Fall Risk (0-5 points) (patient previously educated)  Review of Systems  Neurological:       Tingling   All other systems reviewed and are negative.      Objective:   Physical Exam  Constitutional: He is oriented to person, place, and time. He appears well-developed and well-nourished.  HENT:  Head: Normocephalic and atraumatic.  Neck: Normal range of motion. Neck supple.  Cardiovascular: Normal rate and regular rhythm.   Pulmonary/Chest: Effort normal and breath sounds normal.  Musculoskeletal:  Normal Muscle Bulk and Muscle Testing Reveals: Upper Extremities: Full ROM and Muscle Strength 5/5 Lower Extremities: Full ROM and Muscle Strength 5/5 Right Lower extremities Flexion Produces Pain into lower extremity Left: Full ROM and Muscle Strength 5/5 Arises from chair with ease Narrow Based gait  Neurological: He is alert and oriented to person, place, and time.  Skin: Skin is warm and dry.  Psychiatric: He has a normal mood and  affect.  Nursing note and vitals reviewed.         Assessment & Plan:  1. Right Saphenous Neuritis: Continue current medication regime. Refilled: HYDROcodone 5/325mg  one tablet by mouth BID #60  Schedule for Right saphenous nerve Block with Dr. Dr. Letta Pate 07/22/14. 15 minutes of face to face patient care time was spent during this visit. All questions were encouraged and answered.   F/U in 1 month

## 2014-06-18 LAB — PMP ALCOHOL METABOLITE (ETG): Ethyl Glucuronide (EtG): NEGATIVE ng/mL

## 2014-06-21 LAB — OPIATES/OPIOIDS (LC/MS-MS)
CODEINE URINE: NEGATIVE ng/mL (ref <50)
HYDROMORPHONE: NEGATIVE ng/mL — AB (ref <50)
Hydrocodone: 274 ng/mL (ref <50)
MORPHINE: NEGATIVE ng/mL (ref <50)
NORHYDROCODONE, UR: 187 ng/mL (ref <50)
Noroxycodone, Ur: NEGATIVE ng/mL (ref <50)
Oxycodone, ur: NEGATIVE ng/mL (ref <50)
Oxymorphone: NEGATIVE ng/mL (ref <50)

## 2014-06-22 LAB — PRESCRIPTION MONITORING PROFILE (SOLSTAS)
Amphetamine/Meth: NEGATIVE ng/mL
Barbiturate Screen, Urine: NEGATIVE ng/mL
Benzodiazepine Screen, Urine: NEGATIVE ng/mL
Buprenorphine, Urine: NEGATIVE ng/mL
CANNABINOID SCRN UR: NEGATIVE ng/mL
CARISOPRODOL, URINE: NEGATIVE ng/mL
CREATININE, URINE: 26.44 mg/dL (ref >20.0)
Cocaine Metabolites: NEGATIVE ng/mL
Fentanyl, Ur: NEGATIVE ng/mL
MDMA URINE: NEGATIVE ng/mL
Meperidine, Ur: NEGATIVE ng/mL
Methadone Screen, Urine: NEGATIVE ng/mL
Nitrites, Initial: NEGATIVE ug/mL
OXYCODONE SCRN UR: NEGATIVE ng/mL
PH URINE, INITIAL: 4.7 pH (ref 4.5–8.9)
Propoxyphene: NEGATIVE ng/mL
TAPENTADOLUR: NEGATIVE ng/mL
Tramadol Scrn, Ur: NEGATIVE ng/mL
Zolpidem, Urine: NEGATIVE ng/mL

## 2014-06-25 NOTE — Progress Notes (Signed)
Urine drug screen for this encounter is consistent for prescribed medication 

## 2014-07-22 ENCOUNTER — Ambulatory Visit (HOSPITAL_BASED_OUTPATIENT_CLINIC_OR_DEPARTMENT_OTHER): Payer: 59 | Admitting: Physical Medicine & Rehabilitation

## 2014-07-22 ENCOUNTER — Encounter: Payer: 59 | Attending: Physical Medicine & Rehabilitation

## 2014-07-22 ENCOUNTER — Encounter: Payer: Self-pay | Admitting: Physical Medicine & Rehabilitation

## 2014-07-22 VITALS — BP 146/75 | HR 81 | Resp 14

## 2014-07-22 DIAGNOSIS — G8929 Other chronic pain: Secondary | ICD-10-CM | POA: Diagnosis present

## 2014-07-22 DIAGNOSIS — Z760 Encounter for issue of repeat prescription: Secondary | ICD-10-CM | POA: Diagnosis not present

## 2014-07-22 DIAGNOSIS — K219 Gastro-esophageal reflux disease without esophagitis: Secondary | ICD-10-CM | POA: Diagnosis not present

## 2014-07-22 DIAGNOSIS — J449 Chronic obstructive pulmonary disease, unspecified: Secondary | ICD-10-CM | POA: Diagnosis not present

## 2014-07-22 DIAGNOSIS — G5771 Causalgia of right lower limb: Secondary | ICD-10-CM

## 2014-07-22 DIAGNOSIS — G588 Other specified mononeuropathies: Secondary | ICD-10-CM | POA: Diagnosis not present

## 2014-07-22 DIAGNOSIS — Z87891 Personal history of nicotine dependence: Secondary | ICD-10-CM | POA: Diagnosis not present

## 2014-07-22 DIAGNOSIS — J45909 Unspecified asthma, uncomplicated: Secondary | ICD-10-CM | POA: Diagnosis not present

## 2014-07-22 MED ORDER — HYDROCODONE-ACETAMINOPHEN 5-325 MG PO TABS: 1.0000 | ORAL_TABLET | Freq: Two times a day (BID) | ORAL | Status: DC

## 2014-07-22 NOTE — Progress Notes (Signed)
PROCEDURE: Right saphenous nerve block.  INDICATION: Saphenous neuritis with pain only partially response to  medication management including narcotic analgesics. Last injection was  performed in October 2011. Informed consent was obtained after  describing the risks and benefits of the procedure. These include  bleeding, bruising, infection. He elects to proceed and has given  written consent. EMG stimulator utilized, a right medial tibial flare.  Paresthesias reproduced to the ankle area, area was marked, prepped with  chlorhexidine, entered with 22-gauge 40-mm needle electrode under E-stem  guidance. Medial paresthesias reproduced, 1 mL of 40 mg/mL Depo-Medrol  and 4 mL .25% marcaine.

## 2014-07-22 NOTE — Patient Instructions (Signed)
May do next injection prior to move to [place]

## 2014-08-19 ENCOUNTER — Encounter: Payer: 59 | Attending: Physical Medicine & Rehabilitation | Admitting: Registered Nurse

## 2014-08-19 ENCOUNTER — Encounter: Payer: Self-pay | Admitting: Registered Nurse

## 2014-08-19 VITALS — BP 118/86 | HR 83 | Resp 14

## 2014-08-19 DIAGNOSIS — G5781 Other specified mononeuropathies of right lower limb: Secondary | ICD-10-CM | POA: Diagnosis not present

## 2014-08-19 DIAGNOSIS — Z760 Encounter for issue of repeat prescription: Secondary | ICD-10-CM | POA: Diagnosis present

## 2014-08-19 DIAGNOSIS — K219 Gastro-esophageal reflux disease without esophagitis: Secondary | ICD-10-CM | POA: Insufficient documentation

## 2014-08-19 DIAGNOSIS — G588 Other specified mononeuropathies: Secondary | ICD-10-CM | POA: Diagnosis not present

## 2014-08-19 DIAGNOSIS — Z79899 Other long term (current) drug therapy: Secondary | ICD-10-CM | POA: Diagnosis not present

## 2014-08-19 DIAGNOSIS — G8929 Other chronic pain: Secondary | ICD-10-CM | POA: Diagnosis present

## 2014-08-19 DIAGNOSIS — J449 Chronic obstructive pulmonary disease, unspecified: Secondary | ICD-10-CM | POA: Diagnosis not present

## 2014-08-19 DIAGNOSIS — Z5181 Encounter for therapeutic drug level monitoring: Secondary | ICD-10-CM

## 2014-08-19 DIAGNOSIS — Z87891 Personal history of nicotine dependence: Secondary | ICD-10-CM | POA: Insufficient documentation

## 2014-08-19 DIAGNOSIS — J45909 Unspecified asthma, uncomplicated: Secondary | ICD-10-CM | POA: Insufficient documentation

## 2014-08-19 MED ORDER — HYDROCODONE-ACETAMINOPHEN 5-325 MG PO TABS: 1.0000 | ORAL_TABLET | Freq: Two times a day (BID) | ORAL | Status: DC

## 2014-08-19 NOTE — Progress Notes (Signed)
Subjective:    Patient ID: Christopher Gould, male    DOB: Feb 13, 1956, 59 y.o.   MRN: [id number]  HPI: [name] is a 59 year old male who returns for follow up for chronic pain and medication refill. He says his pain is located in his right lower leg. He rates his pain 3. His current exercise regime is walking for short distances and performing stretching exercises.  S/P right saphenous nerve block with good relief noted.  Pain Inventory Average Pain 8 Pain Right Now 3 My pain is constant, sharp, burning, dull, stabbing, tingling and aching  In the last 24 hours, has pain interfered with the following? General activity 2 Relation with others 1 Enjoyment of life 3 What TIME of day is your pain at its worst? all Sleep (in general) Fair  Pain is worse with: walking, bending, sitting, inactivity, standing and some activites Pain improves with: heat/ice and medication Relief from Meds: 8  Mobility walk without assistance Do you have any goals in this area?  yes  Function disabled: date disabled . Do you have any goals in this area?  yes  Neuro/Psych numbness tingling  Prior Studies Any changes since last visit?  no  Physicians involved in your care Any changes since last visit?  no   Family History  Problem Relation Age of Onset  . Heart disease Mother    History   Social History  . Marital Status: Married    Spouse Name: N/A  . Number of Children: N/A  . Years of Education: N/A   Social History Main Topics  . Smoking status: Former Smoker    Quit date: 04/06/2011  . Smokeless tobacco: Former Systems developer    Quit date: 10/29/1991  . Alcohol Use: No  . Drug Use: No  . Sexual Activity: Not on file   Other Topics Concern  . None   Social History Narrative   Past Surgical History  Procedure Laterality Date  . Spine surgery    . Cholecystectomy  1999  . Colonoscopy N/A 09/03/2012    Procedure: COLONOSCOPY;  Surgeon: Rogene Houston, MD;  [facility];  Service: Endoscopy;  Laterality: N/A;  1030-moved to 1125 Ann to notify pt   Past Medical History  Diagnosis Date  . COPD (chronic obstructive pulmonary disease)   . GERD (gastroesophageal reflux disease)   . Asthma    BP 118/86 mmHg  Pulse 83  Resp 14  SpO2 96%  Opioid Risk Score:   Fall Risk Score: Low Fall Risk (0-5 points)`1  Depression screen PHQ 2/9  Depression screen PHQ 2/9 06/17/2014  Decreased Interest 1  Down, Depressed, Hopeless 0  PHQ - 2 Score 1  Altered sleeping 1  Tired, decreased energy 1  Change in appetite 0  Feeling bad or failure about yourself  0  Trouble concentrating 0  Moving slowly or fidgety/restless 0  Suicidal thoughts 0  PHQ-9 Score 3     Review of Systems  Neurological: Positive for numbness.       Tingling       Objective:   Physical Exam  Constitutional: He is oriented to person, place, and time. He appears well-developed and well-nourished.  HENT:  Head: Normocephalic and atraumatic.  Neck: Normal range of motion. Neck supple.  Cardiovascular: Normal rate and regular rhythm.   Pulmonary/Chest: Effort normal and breath sounds normal.  Musculoskeletal:  Normal Muscle Bulk and Muscle Testing Reveals: Upper Extremities: Full ROM and Muscle  Strength 5/5 Lower Extremities: Full ROM and Muscle Strength 5/5 Arises from chair with ease Narrow Based Gait  Neurological: He is alert and oriented to person, place, and time.  Skin: Skin is warm and dry.  Psychiatric: He has a normal mood and affect.  Nursing note and vitals reviewed.         Assessment & Plan:  1. Right Saphenous Neuritis: Continue current medication regime. Refilled: HYDROcodone 5/325mg  one tablet by mouth BID #60 . Second script given to accommodate appointment.  15 minutes of face to face patient care time was spent during this visit. All questions were encouraged and answered.   F/U in 1 month

## 2014-10-07 ENCOUNTER — Encounter: Payer: 59 | Attending: Physical Medicine & Rehabilitation

## 2014-10-07 ENCOUNTER — Ambulatory Visit: Payer: 59 | Admitting: Physical Medicine & Rehabilitation

## 2014-10-07 DIAGNOSIS — J45909 Unspecified asthma, uncomplicated: Secondary | ICD-10-CM | POA: Insufficient documentation

## 2014-10-07 DIAGNOSIS — Z760 Encounter for issue of repeat prescription: Secondary | ICD-10-CM | POA: Insufficient documentation

## 2014-10-07 DIAGNOSIS — K219 Gastro-esophageal reflux disease without esophagitis: Secondary | ICD-10-CM | POA: Insufficient documentation

## 2014-10-07 DIAGNOSIS — G588 Other specified mononeuropathies: Secondary | ICD-10-CM | POA: Insufficient documentation

## 2014-10-07 DIAGNOSIS — G8929 Other chronic pain: Secondary | ICD-10-CM | POA: Insufficient documentation

## 2014-10-07 DIAGNOSIS — J449 Chronic obstructive pulmonary disease, unspecified: Secondary | ICD-10-CM | POA: Insufficient documentation

## 2014-10-07 DIAGNOSIS — Z87891 Personal history of nicotine dependence: Secondary | ICD-10-CM | POA: Insufficient documentation

## 2014-10-14 ENCOUNTER — Telehealth: Payer: Self-pay | Admitting: Physical Medicine & Rehabilitation

## 2014-10-14 ENCOUNTER — Ambulatory Visit: Payer: 59 | Admitting: Physical Medicine & Rehabilitation

## 2014-10-14 MED ORDER — HYDROCODONE-ACETAMINOPHEN 5-325 MG PO TABS: 1.0000 | ORAL_TABLET | Freq: Two times a day (BID) | ORAL | Status: DC

## 2014-10-14 NOTE — Telephone Encounter (Signed)
Rx printed for Christopher Gould to sign since Kirsteins and Zella Ball out of office.

## 2014-10-28 NOTE — Telephone Encounter (Signed)
RX was never picked up so the hydrocodone 5/325 # 60  Rx was destroyed.

## 2014-11-22 ENCOUNTER — Other Ambulatory Visit: Payer: Self-pay | Admitting: *Deleted

## 2014-11-22 MED ORDER — HYDROCODONE-ACETAMINOPHEN 5-325 MG PO TABS: 1.0000 | ORAL_TABLET | Freq: Two times a day (BID) | ORAL | Status: DC

## 2014-11-22 NOTE — Telephone Encounter (Signed)
Christopher Gould came to pick up his Rx for hydrocodone we printed for him before [place] went [place] of the [place] (he left without picking up) so we destroyed that Rx.  I have printed another one for Dr Letta Pate to sign and he has an appt with him 11/30/14

## 2014-11-30 ENCOUNTER — Encounter: Payer: Self-pay | Admitting: Physical Medicine & Rehabilitation

## 2014-11-30 ENCOUNTER — Ambulatory Visit (HOSPITAL_BASED_OUTPATIENT_CLINIC_OR_DEPARTMENT_OTHER): Payer: 59 | Admitting: Physical Medicine & Rehabilitation

## 2014-11-30 ENCOUNTER — Encounter: Payer: 59 | Attending: Physical Medicine & Rehabilitation

## 2014-11-30 VITALS — BP 103/73 | HR 82

## 2014-11-30 DIAGNOSIS — J45909 Unspecified asthma, uncomplicated: Secondary | ICD-10-CM | POA: Diagnosis not present

## 2014-11-30 DIAGNOSIS — G5771 Causalgia of right lower limb: Secondary | ICD-10-CM

## 2014-11-30 DIAGNOSIS — G8929 Other chronic pain: Secondary | ICD-10-CM | POA: Insufficient documentation

## 2014-11-30 DIAGNOSIS — Z5181 Encounter for therapeutic drug level monitoring: Secondary | ICD-10-CM

## 2014-11-30 DIAGNOSIS — G5781 Other specified mononeuropathies of right lower limb: Secondary | ICD-10-CM | POA: Diagnosis not present

## 2014-11-30 DIAGNOSIS — Z760 Encounter for issue of repeat prescription: Secondary | ICD-10-CM | POA: Diagnosis present

## 2014-11-30 DIAGNOSIS — J449 Chronic obstructive pulmonary disease, unspecified: Secondary | ICD-10-CM | POA: Diagnosis not present

## 2014-11-30 DIAGNOSIS — Z79899 Other long term (current) drug therapy: Secondary | ICD-10-CM

## 2014-11-30 DIAGNOSIS — G588 Other specified mononeuropathies: Secondary | ICD-10-CM | POA: Insufficient documentation

## 2014-11-30 DIAGNOSIS — K219 Gastro-esophageal reflux disease without esophagitis: Secondary | ICD-10-CM | POA: Insufficient documentation

## 2014-11-30 DIAGNOSIS — Z87891 Personal history of nicotine dependence: Secondary | ICD-10-CM | POA: Diagnosis not present

## 2014-11-30 MED ORDER — HYDROCODONE-ACETAMINOPHEN 5-325 MG PO TABS: 1.0000 | ORAL_TABLET | Freq: Two times a day (BID) | ORAL | Status: DC

## 2014-11-30 NOTE — Progress Notes (Signed)
PROCEDURE: Right saphenous nerve block.  INDICATION: Saphenous neuritis with pain only partially response to  medication management including narcotic analgesics. . Informed consent was obtained after  describing the risks and benefits of the procedure. These include  bleeding, bruising, infection. He elects to proceed and has given  written consent. EMG stimulator utilized, a right medial tibial flare.  Paresthesias reproduced to the ankle area, area was marked, prepped with  chlorhexidine, entered with 22-gauge 40-mm needle electrode under E-stem  guidance. Medial paresthesias reproduced, 1 mL of 40 mg/mL Depo-Medrol  and 4 mL .25% marcaine.

## 2014-12-01 ENCOUNTER — Other Ambulatory Visit: Payer: Self-pay | Admitting: Physical Medicine & Rehabilitation

## 2014-12-02 LAB — PMP ALCOHOL METABOLITE (ETG): Ethyl Glucuronide (EtG): NEGATIVE ng/mL

## 2014-12-06 LAB — OPIATES/OPIOIDS (LC/MS-MS)
CODEINE URINE: NEGATIVE ng/mL (ref <50)
HYDROCODONE: 4484 ng/mL (ref <50)
Hydromorphone: 332 ng/mL (ref <50)
Morphine Urine: NEGATIVE ng/mL (ref <50)
NORHYDROCODONE, UR: 4195 ng/mL (ref <50)
NOROXYCODONE, UR: NEGATIVE ng/mL (ref <50)
OXYCODONE, UR: NEGATIVE ng/mL (ref <50)
Oxymorphone: NEGATIVE ng/mL (ref <50)

## 2014-12-07 LAB — PRESCRIPTION MONITORING PROFILE (SOLSTAS)
Amphetamine/Meth: NEGATIVE ng/mL
Barbiturate Screen, Urine: NEGATIVE ng/mL
Benzodiazepine Screen, Urine: NEGATIVE ng/mL
Buprenorphine, Urine: NEGATIVE ng/mL
COCAINE METABOLITES: NEGATIVE ng/mL
Cannabinoid Scrn, Ur: NEGATIVE ng/mL
Carisoprodol, Urine: NEGATIVE ng/mL
Creatinine, Urine: 390.46 mg/dL (ref >20.0)
FENTANYL URINE: NEGATIVE ng/mL
MDMA URINE: NEGATIVE ng/mL
MEPERIDINE UR: NEGATIVE ng/mL
METHADONE SCREEN, URINE: NEGATIVE ng/mL
Nitrites, Initial: NEGATIVE ug/mL
Oxycodone Screen, Ur: NEGATIVE ng/mL
Propoxyphene: NEGATIVE ng/mL
Tapentadol, urine: NEGATIVE ng/mL
Tramadol Scrn, Ur: NEGATIVE ng/mL
Zolpidem, Urine: NEGATIVE ng/mL
pH, Initial: 4.9 pH (ref 4.5–8.9)

## 2014-12-17 NOTE — Progress Notes (Signed)
Urine drug screen for this encounter is consistent for prescribed medication 

## 2015-01-18 ENCOUNTER — Ambulatory Visit (HOSPITAL_BASED_OUTPATIENT_CLINIC_OR_DEPARTMENT_OTHER): Payer: 59 | Admitting: Physical Medicine & Rehabilitation

## 2015-01-18 ENCOUNTER — Encounter: Payer: Self-pay | Admitting: Physical Medicine & Rehabilitation

## 2015-01-18 ENCOUNTER — Encounter: Payer: 59 | Attending: Physical Medicine & Rehabilitation

## 2015-01-18 VITALS — BP 145/80 | HR 70

## 2015-01-18 DIAGNOSIS — G588 Other specified mononeuropathies: Secondary | ICD-10-CM | POA: Insufficient documentation

## 2015-01-18 DIAGNOSIS — Z760 Encounter for issue of repeat prescription: Secondary | ICD-10-CM | POA: Insufficient documentation

## 2015-01-18 DIAGNOSIS — J449 Chronic obstructive pulmonary disease, unspecified: Secondary | ICD-10-CM | POA: Insufficient documentation

## 2015-01-18 DIAGNOSIS — Z87891 Personal history of nicotine dependence: Secondary | ICD-10-CM | POA: Diagnosis not present

## 2015-01-18 DIAGNOSIS — J45909 Unspecified asthma, uncomplicated: Secondary | ICD-10-CM | POA: Diagnosis not present

## 2015-01-18 DIAGNOSIS — K219 Gastro-esophageal reflux disease without esophagitis: Secondary | ICD-10-CM | POA: Diagnosis not present

## 2015-01-18 DIAGNOSIS — G5781 Other specified mononeuropathies of right lower limb: Secondary | ICD-10-CM

## 2015-01-18 DIAGNOSIS — G8929 Other chronic pain: Secondary | ICD-10-CM | POA: Insufficient documentation

## 2015-01-18 DIAGNOSIS — G5771 Causalgia of right lower limb: Secondary | ICD-10-CM | POA: Diagnosis not present

## 2015-01-18 MED ORDER — HYDROCODONE-ACETAMINOPHEN 5-325 MG PO TABS: 1.0000 | ORAL_TABLET | Freq: Two times a day (BID) | ORAL | Status: DC

## 2015-01-18 NOTE — Progress Notes (Signed)
Subjective:    Patient ID: Christopher Gould, male    DOB: 07/16/1955, 59 y.o.   MRN: [id number]  HPI Good results from right saphenous nerve block last performed on 11/30/2014. Continues to take hydrocodone for chronic right lower extremity pain from nerve injury. Patient had originally injury in 2009, nerve pain since back surgery.   Pain Inventory Average Pain 9 Pain Right Now 2 My pain is sharp, burning, stabbing and tingling  In the last 24 hours, has pain interfered with the following? General activity 2 Relation with others 2 Enjoyment of life 2 What TIME of day is your pain at its worst? all Sleep (in general) Fair  Pain is worse with: walking, bending, sitting, inactivity, standing and some activites Pain improves with: rest and medication Relief from Meds: 8  Mobility walk without assistance how many minutes can you walk? 15-20 ability to climb steps?  yes do you drive?  yes  Function disabled: date disabled .  Neuro/Psych numbness tingling  Prior Studies Any changes since last visit?  no  Physicians involved in your care Any changes since last visit?  no   Family History  Problem Relation Age of Onset  . Heart disease Mother    Social History   Social History  . Marital Status: Married    Spouse Name: N/A  . Number of Children: N/A  . Years of Education: N/A   Social History Main Topics  . Smoking status: Former Smoker    Quit date: 04/06/2011  . Smokeless tobacco: Former Systems developer    Quit date: 10/29/1991  . Alcohol Use: No  . Drug Use: No  . Sexual Activity: Not Asked   Other Topics Concern  . None   Social History Narrative   Past Surgical History  Procedure Laterality Date  . Spine surgery    . Cholecystectomy  1999  . Colonoscopy N/A 09/03/2012    Procedure: COLONOSCOPY;  Surgeon: Rogene Houston, MD;  [facility];  Service: Endoscopy;  Laterality: N/A;  1030-moved to 1125 Ann to notify pt   Past Medical History    Diagnosis Date  . COPD (chronic obstructive pulmonary disease) ([place])   . GERD (gastroesophageal reflux disease)   . Asthma    BP 145/80 mmHg  Pulse 70  SpO2 97%  Opioid Risk Score:   Fall Risk Score:  `1  Depression screen PHQ 2/9  Depression screen [facility] 2/9 11/30/2014 06/17/2014  Decreased Interest 1 1  Down, Depressed, Hopeless 0 0  PHQ - 2 Score 1 1  Altered sleeping - 1  Tired, decreased energy - 1  Change in appetite - 0  Feeling bad or failure about yourself  - 0  Trouble concentrating - 0  Moving slowly or fidgety/restless - 0  Suicidal thoughts - 0  PHQ-9 Score - 3     Review of Systems  All other systems reviewed and are negative.      Objective:   Physical Exam  Constitutional: He is oriented to person, place, and time. He appears well-developed and well-nourished.  Musculoskeletal:       Right knee: Normal.       Right ankle: Normal.  Neurological: He is alert and oriented to person, place, and time. A sensory deficit is present.  Hypersensitive Right medial leg  Nursing note and vitals reviewed.  Ambulates without evidence of toe drag or knee instability Mood and affect are appropriate       Assessment & Plan:  1. 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 Will have patient see nurse practitioner next month, schedule the next saphenous nerve block for February

## 2015-01-18 NOTE — Patient Instructions (Signed)
Next visit with nurse practitioner  See you in Feb for repeat  injection

## 2015-02-18 ENCOUNTER — Encounter: Payer: 59 | Admitting: Registered Nurse

## 2015-02-21 ENCOUNTER — Encounter: Payer: 59 | Attending: Physical Medicine & Rehabilitation | Admitting: Registered Nurse

## 2015-02-21 ENCOUNTER — Encounter: Payer: Self-pay | Admitting: Registered Nurse

## 2015-02-21 VITALS — BP 116/85 | HR 88

## 2015-02-21 DIAGNOSIS — G5771 Causalgia of right lower limb: Secondary | ICD-10-CM

## 2015-02-21 DIAGNOSIS — Z79899 Other long term (current) drug therapy: Secondary | ICD-10-CM

## 2015-02-21 DIAGNOSIS — G5781 Other specified mononeuropathies of right lower limb: Secondary | ICD-10-CM

## 2015-02-21 DIAGNOSIS — Z87891 Personal history of nicotine dependence: Secondary | ICD-10-CM | POA: Insufficient documentation

## 2015-02-21 DIAGNOSIS — J449 Chronic obstructive pulmonary disease, unspecified: Secondary | ICD-10-CM | POA: Diagnosis not present

## 2015-02-21 DIAGNOSIS — Z5181 Encounter for therapeutic drug level monitoring: Secondary | ICD-10-CM

## 2015-02-21 DIAGNOSIS — G8929 Other chronic pain: Secondary | ICD-10-CM | POA: Diagnosis present

## 2015-02-21 DIAGNOSIS — K219 Gastro-esophageal reflux disease without esophagitis: Secondary | ICD-10-CM | POA: Insufficient documentation

## 2015-02-21 DIAGNOSIS — J45909 Unspecified asthma, uncomplicated: Secondary | ICD-10-CM | POA: Diagnosis not present

## 2015-02-21 DIAGNOSIS — Z760 Encounter for issue of repeat prescription: Secondary | ICD-10-CM | POA: Diagnosis not present

## 2015-02-21 DIAGNOSIS — G588 Other specified mononeuropathies: Secondary | ICD-10-CM | POA: Diagnosis not present

## 2015-02-21 MED ORDER — HYDROCODONE-ACETAMINOPHEN 5-325 MG PO TABS: 1.0000 | ORAL_TABLET | Freq: Two times a day (BID) | ORAL | Status: DC

## 2015-02-21 NOTE — Progress Notes (Signed)
Subjective:    Patient ID: Christopher Gould, male    DOB: February 20, 1956, 59 y.o.   MRN: [id number]  HPI: Christopher Gould is a 59 year old male who returns for follow up for chronic pain and medication refill. He says his pain is located in his right lower leg. He rates his pain 2. His current exercise regime is walking for short distances and performing stretching exercises.  Scheduled for right saphenous nerve block in February.    Pain Inventory Average Pain 9 Pain Right Now 2 My pain is sharp, burning, stabbing, tingling and aching  In the last 24 hours, has pain interfered with the following? General activity 2 Relation with others 1 Enjoyment of life 2 What TIME of day is your pain at its worst? Morning, Daytime, Evening and Night Sleep (in general) NA  Pain is worse with: walking, bending, sitting, inactivity, standing and some activites Pain improves with: heat/ice and medication Relief from Meds: 8  Mobility walk without assistance how many minutes can you walk? 15-20 ability to climb steps?  yes do you drive?  yes Do you have any goals in this area?  yes  Function disabled: date disabled NA Do you have any goals in this area?  yes  Neuro/Psych tingling  Prior Studies Any changes since last visit?  no  Physicians involved in your care Any changes since last visit?  no   Family History  Problem Relation Age of Onset  . Heart disease Mother    Social History   Social History  . Marital Status: Married    Spouse Name: N/A  . Number of Children: N/A  . Years of Education: N/A   Social History Main Topics  . Smoking status: Former Smoker    Quit date: 04/06/2011  . Smokeless tobacco: Former Systems developer    Quit date: 10/29/1991  . Alcohol Use: No  . Drug Use: No  . Sexual Activity: Not Asked   Other Topics Concern  . None   Social History Narrative   Past Surgical History  Procedure Laterality Date  . Spine surgery    . Cholecystectomy  1999    . Colonoscopy N/A 09/03/2012    Procedure: COLONOSCOPY;  Surgeon: Rogene Houston, MD;  [facility];  Service: Endoscopy;  Laterality: N/A;  1030-moved to 1125 Ann to notify pt   Past Medical History  Diagnosis Date  . COPD (chronic obstructive pulmonary disease) ([place])   . GERD (gastroesophageal reflux disease)   . Asthma    BP 116/85 mmHg  Pulse 88  SpO2 95%  Opioid Risk Score:   Fall Risk Score:  `1  Depression screen PHQ 2/9  Depression screen [facility] 2/9 11/30/2014 06/17/2014  Decreased Interest 1 1  Down, Depressed, Hopeless 0 0  PHQ - 2 Score 1 1  Altered sleeping - 1  Tired, decreased energy - 1  Change in appetite - 0  Feeling bad or failure about yourself  - 0  Trouble concentrating - 0  Moving slowly or fidgety/restless - 0  Suicidal thoughts - 0  PHQ-9 Score - 3     Review of Systems  Neurological:       Tingling  All other systems reviewed and are negative.      Objective:   Physical Exam  Constitutional: He is oriented to person, place, and time. He appears well-developed and well-nourished.  HENT:  Head: Normocephalic and atraumatic.  Neck: Normal range of motion. Neck supple.  Cardiovascular: Normal rate and regular rhythm.   Pulmonary/Chest: Effort normal and breath sounds normal.  Musculoskeletal:  Normal Muscle Bulk and Muscle Testing Reveals: Upper Extremities: Full ROM and Muscle Strength 5/5 Lower Extremities: Full ROM and Muscle Strength 5/5 Arises from chair with ease Narrow Based Gait  Neurological: He is alert and oriented to person, place, and time.  Skin: Skin is warm and dry.  Psychiatric: He has a normal mood and affect.  Nursing note and vitals reviewed.         Assessment & Plan:  1. Right Saphenous Neuritis: Continue current medication regime. Refilled: HYDROcodone 5/325mg  one tablet by mouth BID #60 . Second script given to accommodate appointment.  15 minutes of face to face patient care time was spent during  this visit. All questions were encouraged and answered.   F/U in 1 month

## 2015-03-23 ENCOUNTER — Encounter: Payer: 59 | Attending: Physical Medicine & Rehabilitation | Admitting: Registered Nurse

## 2015-03-23 ENCOUNTER — Encounter: Payer: Self-pay | Admitting: Registered Nurse

## 2015-03-23 ENCOUNTER — Other Ambulatory Visit: Payer: Self-pay | Admitting: Physical Medicine & Rehabilitation

## 2015-03-23 VITALS — BP 127/74 | HR 85 | Resp 12

## 2015-03-23 DIAGNOSIS — G8929 Other chronic pain: Secondary | ICD-10-CM | POA: Diagnosis present

## 2015-03-23 DIAGNOSIS — G5781 Other specified mononeuropathies of right lower limb: Secondary | ICD-10-CM

## 2015-03-23 DIAGNOSIS — Z5181 Encounter for therapeutic drug level monitoring: Secondary | ICD-10-CM

## 2015-03-23 DIAGNOSIS — K219 Gastro-esophageal reflux disease without esophagitis: Secondary | ICD-10-CM | POA: Insufficient documentation

## 2015-03-23 DIAGNOSIS — J45909 Unspecified asthma, uncomplicated: Secondary | ICD-10-CM | POA: Insufficient documentation

## 2015-03-23 DIAGNOSIS — J449 Chronic obstructive pulmonary disease, unspecified: Secondary | ICD-10-CM | POA: Diagnosis not present

## 2015-03-23 DIAGNOSIS — Z760 Encounter for issue of repeat prescription: Secondary | ICD-10-CM | POA: Diagnosis present

## 2015-03-23 DIAGNOSIS — Z79899 Other long term (current) drug therapy: Secondary | ICD-10-CM

## 2015-03-23 DIAGNOSIS — Z87891 Personal history of nicotine dependence: Secondary | ICD-10-CM | POA: Diagnosis not present

## 2015-03-23 DIAGNOSIS — G5771 Causalgia of right lower limb: Secondary | ICD-10-CM | POA: Diagnosis not present

## 2015-03-23 DIAGNOSIS — G588 Other specified mononeuropathies: Secondary | ICD-10-CM | POA: Insufficient documentation

## 2015-03-23 MED ORDER — HYDROCODONE-ACETAMINOPHEN 5-325 MG PO TABS: 1.0000 | ORAL_TABLET | Freq: Two times a day (BID) | ORAL | Status: DC

## 2015-03-23 NOTE — Progress Notes (Signed)
Subjective:    Patient ID: Christopher Gould, male    DOB: 27-Dec-1955, 59 y.o.   MRN: [id number]  HPI: [name] is a 59 year old male who returns for follow up for chronic pain and medication refill. He says his pain is located in his right lower leg. He rates his pain 2. His current exercise regime is walking for short distances and performing stretching exercises.  Scheduled for right saphenous nerve block in February.    Pain Inventory Average Pain 8 Pain Right Now 2 My pain is sharp, burning, dull, stabbing, tingling and aching  In the last 24 hours, has pain interfered with the following? General activity 2 Relation with others 1 Enjoyment of life 2 What TIME of day is your pain at its worst? morning, daytime, evening, night Sleep (in general) Fair  Pain is worse with: walking, inactivity, standing and some activites Pain improves with: heat/ice and medication Relief from Meds: 8  Mobility walk without assistance how many minutes can you walk? 15-20 ability to climb steps?  yes do you drive?  yes Do you have any goals in this area?  yes  Function disabled: date disabled NA Do you have any goals in this area?  yes  Neuro/Psych numbness tingling  Prior Studies Any changes since last visit?  no  Physicians involved in your care Any changes since last visit?  no   Family History  Problem Relation Age of Onset  . Heart disease Mother    Social History   Social History  . Marital Status: Married    Spouse Name: N/A  . Number of Children: N/A  . Years of Education: N/A   Social History Main Topics  . Smoking status: Former Smoker    Quit date: 04/06/2011  . Smokeless tobacco: Former Systems developer    Quit date: 10/29/1991  . Alcohol Use: No  . Drug Use: No  . Sexual Activity: Not Asked   Other Topics Concern  . None   Social History Narrative   Past Surgical History  Procedure Laterality Date  . Spine surgery    . Cholecystectomy  1999  .  Colonoscopy N/A 09/03/2012    Procedure: COLONOSCOPY;  Surgeon: Rogene Houston, MD;  [facility];  Service: Endoscopy;  Laterality: N/A;  1030-moved to 1125 Ann to notify pt   Past Medical History  Diagnosis Date  . COPD (chronic obstructive pulmonary disease) ([place])   . GERD (gastroesophageal reflux disease)   . Asthma    BP 127/74 mmHg  Pulse 85  Resp 12  SpO2 96%  Opioid Risk Score:   Fall Risk Score:  `1  Depression screen PHQ 2/9  Depression screen [facility] 2/9 11/30/2014 06/17/2014  Decreased Interest 1 1  Down, Depressed, Hopeless 0 0  PHQ - 2 Score 1 1  Altered sleeping - 1  Tired, decreased energy - 1  Change in appetite - 0  Feeling bad or failure about yourself  - 0  Trouble concentrating - 0  Moving slowly or fidgety/restless - 0  Suicidal thoughts - 0  PHQ-9 Score - 3     Review of Systems  Neurological: Positive for numbness.       Tingling  All other systems reviewed and are negative.      Objective:   Physical Exam  Constitutional: He is oriented to person, place, and time. He appears well-developed and well-nourished.  HENT:  Head: Normocephalic and atraumatic.  Neck: Normal range  of motion. Neck supple.  Cardiovascular: Normal rate and regular rhythm.   Pulmonary/Chest: Effort normal and breath sounds normal.  Musculoskeletal:  Normal Muscle Bulk and Muscle Testing Reveals: Upper Extremities: Full ROM and Muscle Strength 5/5 Lower Extremities: Full ROM and Muscle Strength 5/5 Arises from chair with ease Narrow Based gait  Neurological: He is alert and oriented to person, place, and time.  Skin: Skin is warm and dry.  Psychiatric: He has a normal mood and affect.  Nursing note and vitals reviewed.         Assessment & Plan:  1. Right Saphenous Neuritis: Continue current medication regime. Refilled: HYDROcodone 5/325mg  one tablet by mouth BID #60. 15 minutes of face to face patient care time was spent during this visit. All  questions were encouraged and answered.   F/U in 1 month

## 2015-03-24 LAB — PMP ALCOHOL METABOLITE (ETG): Ethyl Glucuronide (EtG): NEGATIVE ng/mL

## 2015-03-28 LAB — OPIATES/OPIOIDS (LC/MS-MS)
Codeine Urine: NEGATIVE ng/mL (ref <50)
HYDROMORPHONE: 104 ng/mL (ref <50)
Hydrocodone: 1372 ng/mL (ref <50)
MORPHINE: NEGATIVE ng/mL (ref <50)
NORHYDROCODONE, UR: 1073 ng/mL (ref <50)
Noroxycodone, Ur: NEGATIVE ng/mL (ref <50)
OXYCODONE, UR: NEGATIVE ng/mL (ref <50)
Oxymorphone: NEGATIVE ng/mL (ref <50)

## 2015-03-29 LAB — PRESCRIPTION MONITORING PROFILE (SOLSTAS)
Amphetamine/Meth: NEGATIVE ng/mL
BARBITURATE SCREEN, URINE: NEGATIVE ng/mL
BENZODIAZEPINE SCREEN, URINE: NEGATIVE ng/mL
Buprenorphine, Urine: NEGATIVE ng/mL
CARISOPRODOL, URINE: NEGATIVE ng/mL
Cannabinoid Scrn, Ur: NEGATIVE ng/mL
Cocaine Metabolites: NEGATIVE ng/mL
Creatinine, Urine: 184.96 mg/dL (ref >20.0)
ECSTASY: NEGATIVE ng/mL
Fentanyl, Ur: NEGATIVE ng/mL
METHADONE SCREEN, URINE: NEGATIVE ng/mL
Meperidine, Ur: NEGATIVE ng/mL
NITRITES URINE, INITIAL: NEGATIVE ug/mL
OXYCODONE SCRN UR: NEGATIVE ng/mL
PROPOXYPHENE: NEGATIVE ng/mL
TAPENTADOLUR: NEGATIVE ng/mL
TRAMADOL UR: NEGATIVE ng/mL
Zolpidem, Urine: NEGATIVE ng/mL
pH, Initial: 4.7 pH (ref 4.5–8.9)

## 2015-04-12 NOTE — Progress Notes (Signed)
Urine drug screen for this encounter is consistent for prescribed medication 

## 2015-04-22 ENCOUNTER — Encounter: Payer: Self-pay | Admitting: Registered Nurse

## 2015-04-22 ENCOUNTER — Encounter: Payer: 59 | Attending: Physical Medicine & Rehabilitation | Admitting: Registered Nurse

## 2015-04-22 VITALS — BP 144/79 | HR 94

## 2015-04-22 DIAGNOSIS — G5771 Causalgia of right lower limb: Secondary | ICD-10-CM | POA: Diagnosis not present

## 2015-04-22 DIAGNOSIS — Z5181 Encounter for therapeutic drug level monitoring: Secondary | ICD-10-CM | POA: Diagnosis not present

## 2015-04-22 DIAGNOSIS — K219 Gastro-esophageal reflux disease without esophagitis: Secondary | ICD-10-CM | POA: Diagnosis not present

## 2015-04-22 DIAGNOSIS — Z760 Encounter for issue of repeat prescription: Secondary | ICD-10-CM | POA: Diagnosis present

## 2015-04-22 DIAGNOSIS — G5781 Other specified mononeuropathies of right lower limb: Secondary | ICD-10-CM | POA: Diagnosis not present

## 2015-04-22 DIAGNOSIS — J45909 Unspecified asthma, uncomplicated: Secondary | ICD-10-CM | POA: Diagnosis not present

## 2015-04-22 DIAGNOSIS — G8929 Other chronic pain: Secondary | ICD-10-CM | POA: Insufficient documentation

## 2015-04-22 DIAGNOSIS — Z79899 Other long term (current) drug therapy: Secondary | ICD-10-CM | POA: Diagnosis not present

## 2015-04-22 DIAGNOSIS — G588 Other specified mononeuropathies: Secondary | ICD-10-CM | POA: Diagnosis not present

## 2015-04-22 DIAGNOSIS — J449 Chronic obstructive pulmonary disease, unspecified: Secondary | ICD-10-CM | POA: Insufficient documentation

## 2015-04-22 DIAGNOSIS — Z87891 Personal history of nicotine dependence: Secondary | ICD-10-CM | POA: Insufficient documentation

## 2015-04-22 MED ORDER — HYDROCODONE-ACETAMINOPHEN 5-325 MG PO TABS: 1.0000 | ORAL_TABLET | Freq: Two times a day (BID) | ORAL | Status: DC

## 2015-04-22 NOTE — Progress Notes (Signed)
Subjective:    Patient ID: Christopher Gould, male    DOB: 1955/08/05, 60 y.o.   MRN: [id number]  HPI: [name] is a 60 year old male who returns for follow up for chronic pain and medication refill. He says his pain is located in his right lower leg. He rates his pain 2. His current exercise regime is walking for short distances and performing stretching exercises.  Scheduled for right saphenous nerve block in February.   Pain Inventory Average Pain 8 Pain Right Now 2 My pain is sharp, burning, dull, stabbing, tingling and aching  In the last 24 hours, has pain interfered with the following? General activity 2 Relation with others 1 Enjoyment of life 3 What TIME of day is your pain at its worst? varies Sleep (in general) NA  Pain is worse with: walking, bending, sitting, inactivity, standing and some activites Pain improves with: heat/ice and medication Relief from Meds: 8  Mobility walk without assistance how many minutes can you walk? 15-20  Function retired  Neuro/Psych numbness tingling  Prior Studies Any changes since last visit?  no  Physicians involved in your care Any changes since last visit?  no   Family History  Problem Relation Age of Onset  . Heart disease Mother    Social History   Social History  . Marital Status: Married    Spouse Name: N/A  . Number of Children: N/A  . Years of Education: N/A   Social History Main Topics  . Smoking status: Former Smoker    Quit date: 04/06/2011  . Smokeless tobacco: Former Systems developer    Quit date: 10/29/1991  . Alcohol Use: No  . Drug Use: No  . Sexual Activity: Not Asked   Other Topics Concern  . None   Social History Narrative   Past Surgical History  Procedure Laterality Date  . Spine surgery    . Cholecystectomy  1999  . Colonoscopy N/A 09/03/2012    Procedure: COLONOSCOPY;  Surgeon: Rogene Houston, MD;  [facility];  Service: Endoscopy;  Laterality: N/A;  1030-moved to  1125 Ann to notify pt   Past Medical History  Diagnosis Date  . COPD (chronic obstructive pulmonary disease) ([place])   . GERD (gastroesophageal reflux disease)   . Asthma    BP 144/79 mmHg  Pulse 94  SpO2 95%  Opioid Risk Score:   Fall Risk Score:  `1  Depression screen PHQ 2/9  Depression screen [facility] 2/9 04/22/2015 11/30/2014 06/17/2014  Decreased Interest 1 1 1   Down, Depressed, Hopeless 0 0 0  PHQ - 2 Score 1 1 1   Altered sleeping - - 1  Tired, decreased energy - - 1  Change in appetite - - 0  Feeling bad or failure about yourself  - - 0  Trouble concentrating - - 0  Moving slowly or fidgety/restless - - 0  Suicidal thoughts - - 0  PHQ-9 Score - - 3    Review of Systems  All other systems reviewed and are negative.      Objective:   Physical Exam  Constitutional: He is oriented to person, place, and time. He appears well-developed and well-nourished.  HENT:  Head: Normocephalic and atraumatic.  Neck: Normal range of motion. Neck supple.  Cardiovascular: Normal rate and regular rhythm.   Pulmonary/Chest: Effort normal and breath sounds normal.  Musculoskeletal:  Normal Muscle Bulk and Muscle Testing Reveals: Upper Extremities: Full ROM and Muscle Strength 5/5 Lower Extremities:  Full ROM and Muscle Strength 5/5 Right Lower Extremity Flexion Produces Pain into Lower Extremity Arises from chair with ease Narrow Based gait  Neurological: He is alert and oriented to person, place, and time.  Skin: Skin is warm and dry.  Psychiatric: He has a normal mood and affect.  Nursing note and vitals reviewed.         Assessment & Plan:  1. Right Saphenous Neuritis: Continue current medication regime. Refilled: HYDROcodone 5/325mg  one tablet by mouth BID #60.  15 minutes of face to face patient care time was spent during this visit. All questions were encouraged and answered.   F/U in 1 month

## 2015-05-17 ENCOUNTER — Encounter: Payer: 59 | Attending: Physical Medicine & Rehabilitation

## 2015-05-17 ENCOUNTER — Ambulatory Visit (HOSPITAL_BASED_OUTPATIENT_CLINIC_OR_DEPARTMENT_OTHER): Payer: 59 | Admitting: Physical Medicine & Rehabilitation

## 2015-05-17 ENCOUNTER — Encounter: Payer: Self-pay | Admitting: Physical Medicine & Rehabilitation

## 2015-05-17 VITALS — BP 110/81 | HR 78 | Resp 16

## 2015-05-17 DIAGNOSIS — Z760 Encounter for issue of repeat prescription: Secondary | ICD-10-CM | POA: Insufficient documentation

## 2015-05-17 DIAGNOSIS — G8929 Other chronic pain: Secondary | ICD-10-CM | POA: Diagnosis present

## 2015-05-17 DIAGNOSIS — Z87891 Personal history of nicotine dependence: Secondary | ICD-10-CM | POA: Diagnosis not present

## 2015-05-17 DIAGNOSIS — G588 Other specified mononeuropathies: Secondary | ICD-10-CM | POA: Insufficient documentation

## 2015-05-17 DIAGNOSIS — G5781 Other specified mononeuropathies of right lower limb: Secondary | ICD-10-CM

## 2015-05-17 DIAGNOSIS — J45909 Unspecified asthma, uncomplicated: Secondary | ICD-10-CM | POA: Insufficient documentation

## 2015-05-17 DIAGNOSIS — K219 Gastro-esophageal reflux disease without esophagitis: Secondary | ICD-10-CM | POA: Insufficient documentation

## 2015-05-17 DIAGNOSIS — J449 Chronic obstructive pulmonary disease, unspecified: Secondary | ICD-10-CM | POA: Insufficient documentation

## 2015-05-17 MED ORDER — HYDROCODONE-ACETAMINOPHEN 5-325 MG PO TABS: 1.0000 | ORAL_TABLET | Freq: Two times a day (BID) | ORAL | Status: DC

## 2015-05-17 NOTE — Patient Instructions (Signed)
Injected Marcaine and Celestone today

## 2015-05-17 NOTE — Progress Notes (Signed)
PROCEDURE: Right saphenous nerve block.  INDICATION: Saphenous neuritis with pain only partially response to  medication management including narcotic analgesics. . Informed consent was obtained after  describing the risks and benefits of the procedure. These include  bleeding, bruising, infection. He elects to proceed and has given  written consent. EMG stimulator utilized, a right medial tibial flare.  Paresthesias reproduced to the ankle area, area was marked, prepped with  chlorhexidine, entered with 22-gauge 40-mm needle electrode under E-stem  guidance. Medial paresthesias reproduced, 1 mL of 40 mg/mL Depo-Medrol  and 4 mL .25% marcaine.  

## 2015-06-20 ENCOUNTER — Encounter: Payer: 59 | Attending: Physical Medicine & Rehabilitation | Admitting: Registered Nurse

## 2015-06-20 ENCOUNTER — Encounter: Payer: Self-pay | Admitting: Registered Nurse

## 2015-06-20 VITALS — BP 135/82 | HR 82 | Resp 14

## 2015-06-20 DIAGNOSIS — G5771 Causalgia of right lower limb: Secondary | ICD-10-CM

## 2015-06-20 DIAGNOSIS — Z79899 Other long term (current) drug therapy: Secondary | ICD-10-CM | POA: Diagnosis not present

## 2015-06-20 DIAGNOSIS — Z87891 Personal history of nicotine dependence: Secondary | ICD-10-CM | POA: Diagnosis not present

## 2015-06-20 DIAGNOSIS — J449 Chronic obstructive pulmonary disease, unspecified: Secondary | ICD-10-CM | POA: Insufficient documentation

## 2015-06-20 DIAGNOSIS — G5781 Other specified mononeuropathies of right lower limb: Secondary | ICD-10-CM | POA: Diagnosis not present

## 2015-06-20 DIAGNOSIS — G588 Other specified mononeuropathies: Secondary | ICD-10-CM | POA: Diagnosis not present

## 2015-06-20 DIAGNOSIS — J45909 Unspecified asthma, uncomplicated: Secondary | ICD-10-CM | POA: Insufficient documentation

## 2015-06-20 DIAGNOSIS — G8929 Other chronic pain: Secondary | ICD-10-CM | POA: Insufficient documentation

## 2015-06-20 DIAGNOSIS — Z5181 Encounter for therapeutic drug level monitoring: Secondary | ICD-10-CM | POA: Diagnosis not present

## 2015-06-20 DIAGNOSIS — K219 Gastro-esophageal reflux disease without esophagitis: Secondary | ICD-10-CM | POA: Insufficient documentation

## 2015-06-20 DIAGNOSIS — Z760 Encounter for issue of repeat prescription: Secondary | ICD-10-CM | POA: Insufficient documentation

## 2015-06-20 MED ORDER — HYDROCODONE-ACETAMINOPHEN 5-325 MG PO TABS: 1.0000 | ORAL_TABLET | Freq: Two times a day (BID) | ORAL | Status: DC

## 2015-06-20 NOTE — Progress Notes (Signed)
Subjective:    Patient ID: Christopher Gould, male    DOB: 1955/06/17, 60 y.o.   MRN: [id number]  HPI: Mr. BRAILON GOJCAJ is a 60 year old male who returns for follow up for chronic pain and medication refill. He states his pain is located in his right lower leg. He rates his pain 3. His current exercise regime is walking for short distances and performing stretching exercises.  S/P right saphenous nerve block with relief noted.   Pain Inventory Average Pain 9 Pain Right Now 3 My pain is sharp, burning, stabbing, tingling and aching  In the last 24 hours, has pain interfered with the following? General activity 2 Relation with others 2 Enjoyment of life 2 What TIME of day is your pain at its worst? morning, daytime, evening, night Sleep (in general) Fair  Pain is worse with: walking, inactivity and standing Pain improves with: heat/ice and medication Relief from Meds: 8  Mobility walk without assistance how many minutes can you walk? 15-20 ability to climb steps?  yes do you drive?  yes Do you have any goals in this area?  yes  Function disabled: date disabled NA Do you have any goals in this area?  yes  Neuro/Psych numbness tingling  Prior Studies Any changes since last visit?  no  Physicians involved in your care Any changes since last visit?  no   Family History  Problem Relation Age of Onset  . Heart disease Mother    Social History   Social History  . Marital Status: Married    Spouse Name: N/A  . Number of Children: N/A  . Years of Education: N/A   Social History Main Topics  . Smoking status: Former Smoker    Quit date: 04/06/2011  . Smokeless tobacco: Former Systems developer    Quit date: 10/29/1991  . Alcohol Use: No  . Drug Use: No  . Sexual Activity: Not Asked   Other Topics Concern  . None   Social History Narrative   Past Surgical History  Procedure Laterality Date  . Spine surgery    . Cholecystectomy  1999  . Colonoscopy N/A 09/03/2012      Procedure: COLONOSCOPY;  Surgeon: Rogene Houston, MD;  [facility];  Service: Endoscopy;  Laterality: N/A;  1030-moved to 1125 Ann to notify pt   Past Medical History  Diagnosis Date  . COPD (chronic obstructive pulmonary disease) ([place])   . GERD (gastroesophageal reflux disease)   . Asthma    BP 135/82 mmHg  Pulse 82  Resp 14  SpO2 95%  Opioid Risk Score:   Fall Risk Score:  `1  Depression screen PHQ 2/9  Depression screen [facility] 2/9 06/20/2015 04/22/2015 11/30/2014 06/17/2014  Decreased Interest 2 1 1 1   Down, Depressed, Hopeless 0 0 0 0  PHQ - 2 Score 2 1 1 1   Altered sleeping 1 - - 1  Tired, decreased energy 1 - - 1  Change in appetite 2 - - 0  Feeling bad or failure about yourself  0 - - 0  Trouble concentrating 1 - - 0  Moving slowly or fidgety/restless 0 - - 0  Suicidal thoughts 0 - - 0  PHQ-9 Score 7 - - 3  Difficult doing work/chores Not difficult at all - - -     Review of Systems  Neurological: Positive for numbness.       Tingling   All other systems reviewed and are negative.  Objective:   Physical Exam  Constitutional: He is oriented to person, place, and time. He appears well-developed and well-nourished.  HENT:  Head: Normocephalic and atraumatic.  Neck: Normal range of motion. Neck supple.  Cardiovascular: Normal rate and regular rhythm.   Pulmonary/Chest: Effort normal and breath sounds normal.  Musculoskeletal:  Normal Muscle Bulk and Muscle Testing Reveals: Upper Extremities: Full ROM and Muscle Strength 5/5 Lower Extremities: Full ROM and Muscle Strength 5/5 Right Lower extremity Flexion Produces pain into Right lower extremity Arises from chair with ease Narrow Based Gait  Neurological: He is alert and oriented to person, place, and time.  Skin: Skin is warm and dry.  Psychiatric: He has a normal mood and affect.  Nursing note and vitals reviewed.         Assessment & Plan:  1. Right Saphenous Neuritis: Continue  current medication regime. Refilled: HYDROcodone 5/325mg  one tablet by mouth BID #60.  15 minutes of face to face patient care time was spent during this visit. All questions were encouraged and answered.   F/U in 1 month

## 2015-07-19 ENCOUNTER — Ambulatory Visit: Payer: 59 | Admitting: Physical Medicine & Rehabilitation

## 2015-07-21 ENCOUNTER — Encounter: Payer: Self-pay | Admitting: Registered Nurse

## 2015-07-21 ENCOUNTER — Encounter: Payer: 59 | Attending: Physical Medicine & Rehabilitation | Admitting: Registered Nurse

## 2015-07-21 VITALS — BP 116/83 | HR 87 | Resp 14

## 2015-07-21 DIAGNOSIS — G8929 Other chronic pain: Secondary | ICD-10-CM | POA: Diagnosis present

## 2015-07-21 DIAGNOSIS — K219 Gastro-esophageal reflux disease without esophagitis: Secondary | ICD-10-CM | POA: Diagnosis not present

## 2015-07-21 DIAGNOSIS — Z760 Encounter for issue of repeat prescription: Secondary | ICD-10-CM | POA: Diagnosis present

## 2015-07-21 DIAGNOSIS — G5771 Causalgia of right lower limb: Secondary | ICD-10-CM

## 2015-07-21 DIAGNOSIS — J449 Chronic obstructive pulmonary disease, unspecified: Secondary | ICD-10-CM | POA: Insufficient documentation

## 2015-07-21 DIAGNOSIS — J45909 Unspecified asthma, uncomplicated: Secondary | ICD-10-CM | POA: Insufficient documentation

## 2015-07-21 DIAGNOSIS — G5781 Other specified mononeuropathies of right lower limb: Secondary | ICD-10-CM

## 2015-07-21 DIAGNOSIS — Z5181 Encounter for therapeutic drug level monitoring: Secondary | ICD-10-CM | POA: Diagnosis not present

## 2015-07-21 DIAGNOSIS — Z79899 Other long term (current) drug therapy: Secondary | ICD-10-CM | POA: Diagnosis not present

## 2015-07-21 DIAGNOSIS — G588 Other specified mononeuropathies: Secondary | ICD-10-CM | POA: Diagnosis not present

## 2015-07-21 DIAGNOSIS — Z87891 Personal history of nicotine dependence: Secondary | ICD-10-CM | POA: Diagnosis not present

## 2015-07-21 MED ORDER — HYDROCODONE-ACETAMINOPHEN 5-325 MG PO TABS: 1.0000 | ORAL_TABLET | Freq: Two times a day (BID) | ORAL | Status: DC

## 2015-07-21 NOTE — Progress Notes (Signed)
Subjective:    Patient ID: Christopher Gould, male    DOB: 08-16-1955, 60 y.o.   MRN: [id number]  HPI: [name] is a 60 year old male who returns for follow up for chronic pain and medication refill. He states his pain is located in his right lower extremity. He rates his pain 2. His current exercise regime is walking for short distances and performing stretching exercises.  Pain Inventory Average Pain 9 Pain Right Now 2 My pain is sharp, burning, dull, stabbing, tingling and aching  In the last 24 hours, has pain interfered with the following? General activity 2 Relation with others 1 Enjoyment of life 2 What TIME of day is your pain at its worst? all Sleep (in general) Fair  Pain is worse with: walking, bending, sitting, inactivity, standing and some activites Pain improves with: heat/ice and medication Relief from Meds: 8  Mobility walk without assistance how many minutes can you walk? 15-20 Do you have any goals in this area?  yes  Function disabled: date disabled . Do you have any goals in this area?  yes  Neuro/Psych numbness tingling  Prior Studies Any changes since last visit?  no  Physicians involved in your care Any changes since last visit?  no   Family History  Problem Relation Age of Onset  . Heart disease Mother    Social History   Social History  . Marital Status: Married    Spouse Name: N/A  . Number of Children: N/A  . Years of Education: N/A   Social History Main Topics  . Smoking status: Former Smoker    Quit date: 04/06/2011  . Smokeless tobacco: Former Systems developer    Quit date: 10/29/1991  . Alcohol Use: No  . Drug Use: No  . Sexual Activity: Not Asked   Other Topics Concern  . None   Social History Narrative   Past Surgical History  Procedure Laterality Date  . Spine surgery    . Cholecystectomy  1999  . Colonoscopy N/A 09/03/2012    Procedure: COLONOSCOPY;  Surgeon: Rogene Houston, MD;  [facility];   Service: Endoscopy;  Laterality: N/A;  1030-moved to 1125 Ann to notify pt   Past Medical History  Diagnosis Date  . COPD (chronic obstructive pulmonary disease) ([place])   . GERD (gastroesophageal reflux disease)   . Asthma    BP 116/83 mmHg  Pulse 87  Resp 14  SpO2 96%  Opioid Risk Score:   Fall Risk Score:  `1  Depression screen PHQ 2/9  Depression screen [facility] 2/9 06/20/2015 04/22/2015 11/30/2014 06/17/2014  Decreased Interest 2 1 1 1   Down, Depressed, Hopeless 0 0 0 0  PHQ - 2 Score 2 1 1 1   Altered sleeping 1 - - 1  Tired, decreased energy 1 - - 1  Change in appetite 2 - - 0  Feeling bad or failure about yourself  0 - - 0  Trouble concentrating 1 - - 0  Moving slowly or fidgety/restless 0 - - 0  Suicidal thoughts 0 - - 0  PHQ-9 Score 7 - - 3  Difficult doing work/chores Not difficult at all - - -     Review of Systems  All other systems reviewed and are negative.      Objective:   Physical Exam  Constitutional: He is oriented to person, place, and time. He appears well-developed and well-nourished.  HENT:  Head: Normocephalic and atraumatic.  Neck: Normal range  of motion. Neck supple.  Cardiovascular: Normal rate and regular rhythm.   Pulmonary/Chest: Effort normal and breath sounds normal.  Musculoskeletal:  Normal Muscle Bulk and Muscle Testing Reveals: Upper Extremities: Full ROM and Muscle Strength 5/5 Lower Extremities: Full ROM and Muscle Strength 5/5 Arises from chair with ease Narrow Based Gait  Neurological: He is alert and oriented to person, place, and time.  Skin: Skin is warm and dry.  Psychiatric: He has a normal mood and affect.  Nursing note and vitals reviewed.         Assessment & Plan:  1. Right Saphenous Neuritis: Continue current medication regime. Refilled: HYDROcodone 5/325mg  one tablet by mouth BID #60. We will continue the opioid monitoring program, this consists of regular clinic visits, examinations, urine drug screen, pill  counts as well as use of [place] Controlled Substance Reporting System.  15 minutes of face to face patient care time was spent during this visit. All questions were encouraged and answered.   F/U in 1 month

## 2015-07-27 LAB — TOXASSURE SELECT,+ANTIDEPR,UR: PDF: 0

## 2015-07-29 NOTE — Progress Notes (Signed)
Urine drug screen for this encounter is consistent for prescribed medication 

## 2015-08-18 ENCOUNTER — Encounter: Payer: 59 | Attending: Physical Medicine & Rehabilitation | Admitting: Registered Nurse

## 2015-08-18 ENCOUNTER — Encounter: Payer: Self-pay | Admitting: Registered Nurse

## 2015-08-18 VITALS — BP 138/81 | HR 71

## 2015-08-18 DIAGNOSIS — Z79899 Other long term (current) drug therapy: Secondary | ICD-10-CM | POA: Diagnosis not present

## 2015-08-18 DIAGNOSIS — Z760 Encounter for issue of repeat prescription: Secondary | ICD-10-CM | POA: Insufficient documentation

## 2015-08-18 DIAGNOSIS — G5771 Causalgia of right lower limb: Secondary | ICD-10-CM

## 2015-08-18 DIAGNOSIS — J449 Chronic obstructive pulmonary disease, unspecified: Secondary | ICD-10-CM | POA: Diagnosis not present

## 2015-08-18 DIAGNOSIS — G8929 Other chronic pain: Secondary | ICD-10-CM | POA: Insufficient documentation

## 2015-08-18 DIAGNOSIS — Z87891 Personal history of nicotine dependence: Secondary | ICD-10-CM | POA: Diagnosis not present

## 2015-08-18 DIAGNOSIS — G5781 Other specified mononeuropathies of right lower limb: Secondary | ICD-10-CM

## 2015-08-18 DIAGNOSIS — K219 Gastro-esophageal reflux disease without esophagitis: Secondary | ICD-10-CM | POA: Insufficient documentation

## 2015-08-18 DIAGNOSIS — G588 Other specified mononeuropathies: Secondary | ICD-10-CM | POA: Insufficient documentation

## 2015-08-18 DIAGNOSIS — J45909 Unspecified asthma, uncomplicated: Secondary | ICD-10-CM | POA: Insufficient documentation

## 2015-08-18 DIAGNOSIS — Z5181 Encounter for therapeutic drug level monitoring: Secondary | ICD-10-CM

## 2015-08-18 MED ORDER — HYDROCODONE-ACETAMINOPHEN 5-325 MG PO TABS: 1.0000 | ORAL_TABLET | Freq: Two times a day (BID) | ORAL | Status: AC

## 2015-08-18 NOTE — Progress Notes (Signed)
Subjective:    Patient ID: Christopher Gould, male    DOB: 1955/05/31, 60 y.o.   MRN: [id number]  HPI: Mr. LEOTHA URTADO is a 60 year old male who returns for follow up for chronic pain and medication refill. He states his pain is located in his right lower extremity. He rates his pain 2. His current exercise regime is walking for short distances and performing stretching exercises.  Pain Inventory Average Pain 9 Pain Right Now 2 My pain is sharp, burning, dull, stabbing, tingling and aching  In the last 24 hours, has pain interfered with the following? General activity 2 Relation with others 1 Enjoyment of life 2 What TIME of day is your pain at its worst? NA Sleep (in general) Fair  Pain is worse with: walking, bending, sitting, inactivity, standing and some activites Pain improves with: heat/ice and medication Relief from Meds: 7  Mobility how many minutes can you walk? 15-20 ability to climb steps?  yes do you drive?  yes Do you have any goals in this area?  yes  Function disabled: date disabled NA Do you have any goals in this area?  yes  Neuro/Psych numbness tingling  Prior Studies Any changes since last visit?  no  Physicians involved in your care Any changes since last visit?  no   Family History  Problem Relation Age of Onset  . Heart disease Mother    Social History   Social History  . Marital Status: Married    Spouse Name: N/A  . Number of Children: N/A  . Years of Education: N/A   Social History Main Topics  . Smoking status: Former Smoker    Quit date: 04/06/2011  . Smokeless tobacco: Former Systems developer    Quit date: 10/29/1991  . Alcohol Use: No  . Drug Use: No  . Sexual Activity: Not Asked   Other Topics Concern  . None   Social History Narrative   Past Surgical History  Procedure Laterality Date  . Spine surgery    . Cholecystectomy  1999  . Colonoscopy N/A 09/03/2012    Procedure: COLONOSCOPY;  Surgeon: Rogene Houston, MD;   [facility];  Service: Endoscopy;  Laterality: N/A;  1030-moved to 1125 Ann to notify pt   Past Medical History  Diagnosis Date  . COPD (chronic obstructive pulmonary disease) ([place])   . GERD (gastroesophageal reflux disease)   . Asthma    BP 138/81 mmHg  Pulse 71  SpO2 95%  Opioid Risk Score:   Fall Risk Score:  `1  Depression screen PHQ 2/9  Depression screen [facility] 2/9 06/20/2015 04/22/2015 11/30/2014 06/17/2014  Decreased Interest 2 1 1 1   Down, Depressed, Hopeless 0 0 0 0  PHQ - 2 Score 2 1 1 1   Altered sleeping 1 - - 1  Tired, decreased energy 1 - - 1  Change in appetite 2 - - 0  Feeling bad or failure about yourself  0 - - 0  Trouble concentrating 1 - - 0  Moving slowly or fidgety/restless 0 - - 0  Suicidal thoughts 0 - - 0  PHQ-9 Score 7 - - 3  Difficult doing work/chores Not difficult at all - - -     Review of Systems  Neurological: Positive for numbness.       Tingling   All other systems reviewed and are negative.      Objective:   Physical Exam  Constitutional: He is oriented to person, place,  and time. He appears well-developed and well-nourished.  HENT:  Head: Normocephalic and atraumatic.  Neck: Normal range of motion. Neck supple.  Cardiovascular: Normal rate and regular rhythm.   Pulmonary/Chest: Effort normal and breath sounds normal.  Musculoskeletal:  Normal Muscle Bulk and Muscle Testing Reveals: Upper Extremities: Full ROM and Muscle Strength 5/5 Lower Extremities: Full ROM and Muscle Strength 5/5 Arises from chair with ease Narrow Based Gait  Neurological: He is alert and oriented to person, place, and time.  Skin: Skin is warm and dry.  Psychiatric: He has a normal mood and affect.  Nursing note and vitals reviewed.         Assessment & Plan:  1. Right Saphenous Neuritis: Continue current medication regime. Refilled: HYDROcodone 5/325mg  one tablet by mouth BID #60. We will continue the opioid monitoring program, this  consists of regular clinic visits, examinations, urine drug screen, pill counts as well as use of [place] Controlled Substance Reporting System.  15 minutes of face to face patient care time was spent during this visit. All questions were encouraged and answered.   F/U in 1 month

## 2015-09-20 ENCOUNTER — Ambulatory Visit: Payer: 59 | Admitting: Registered Nurse

## 2016-01-21 IMAGING — CR DG CHEST 2V
2 series · 2 of 2 positions shown · non-contrast
Comparison: 06/03/2013

CLINICAL DATA: COPD.  Short of breath.

EXAM:
CHEST  2 VIEW

[view not recorded (1 of 2)]
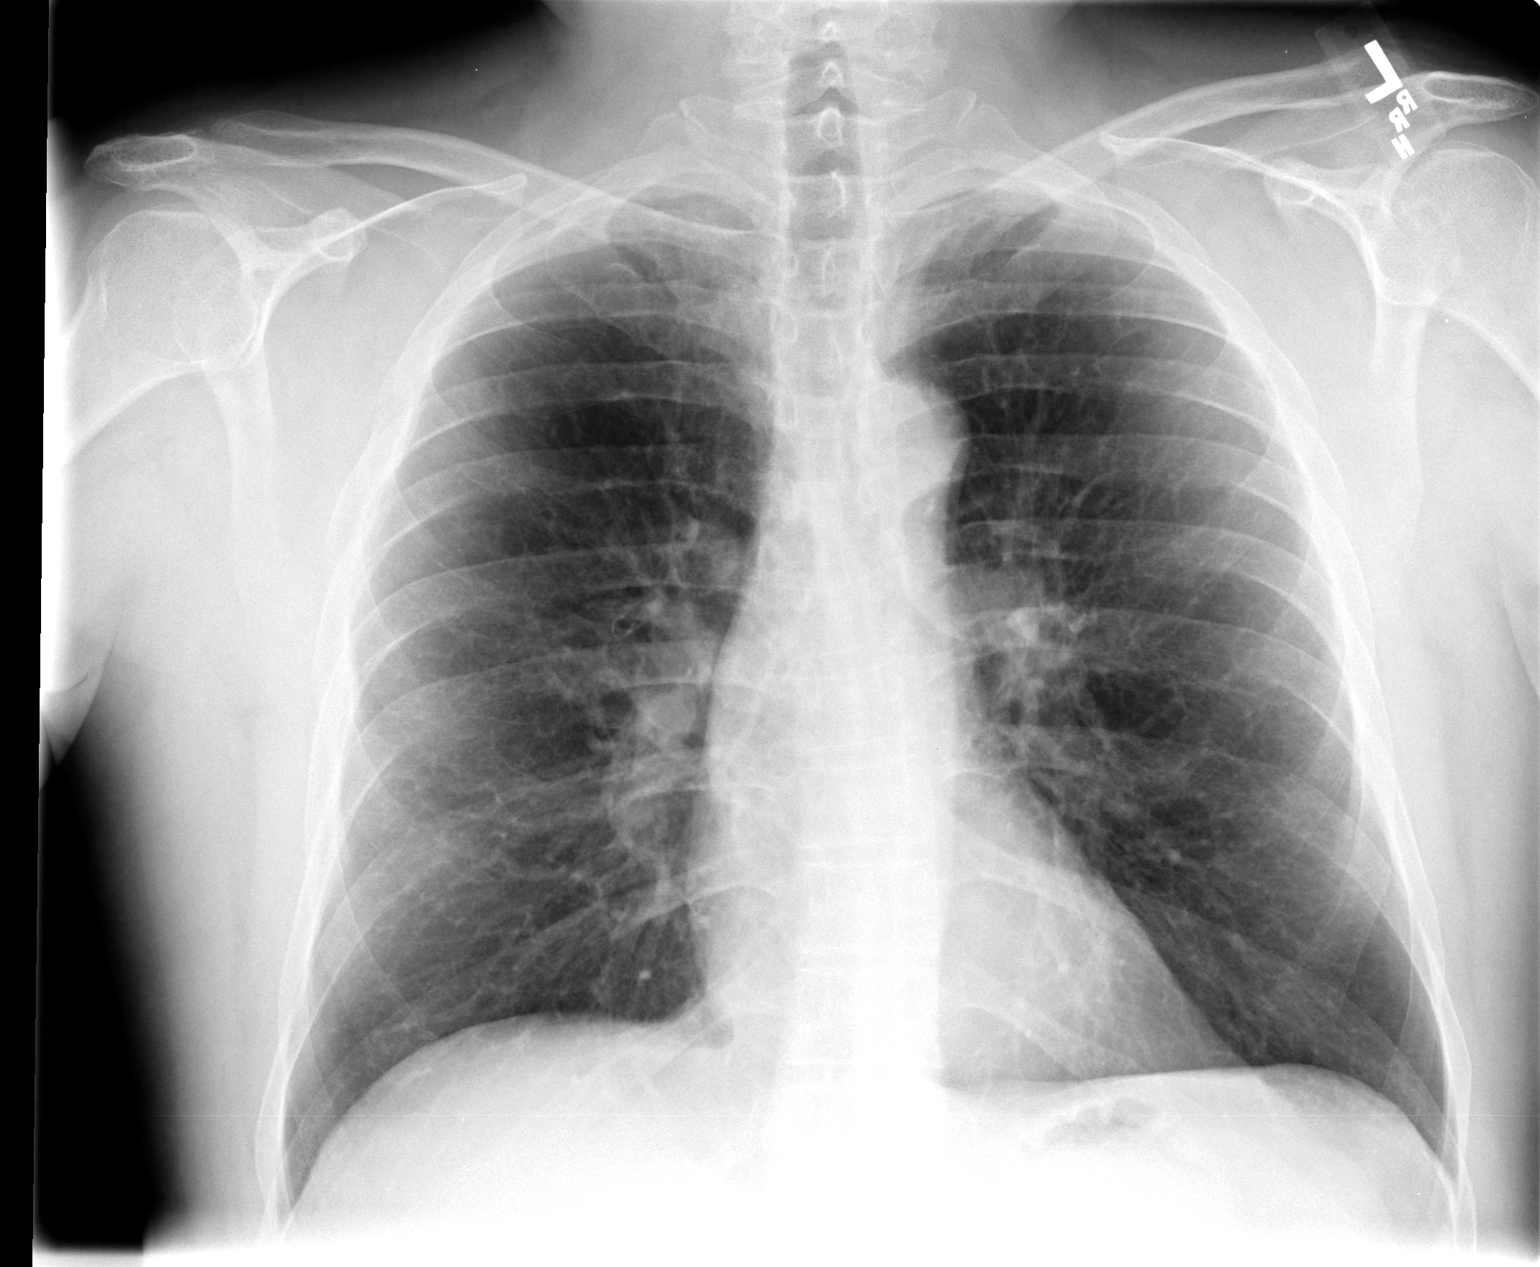

[view not recorded (2 of 2)]
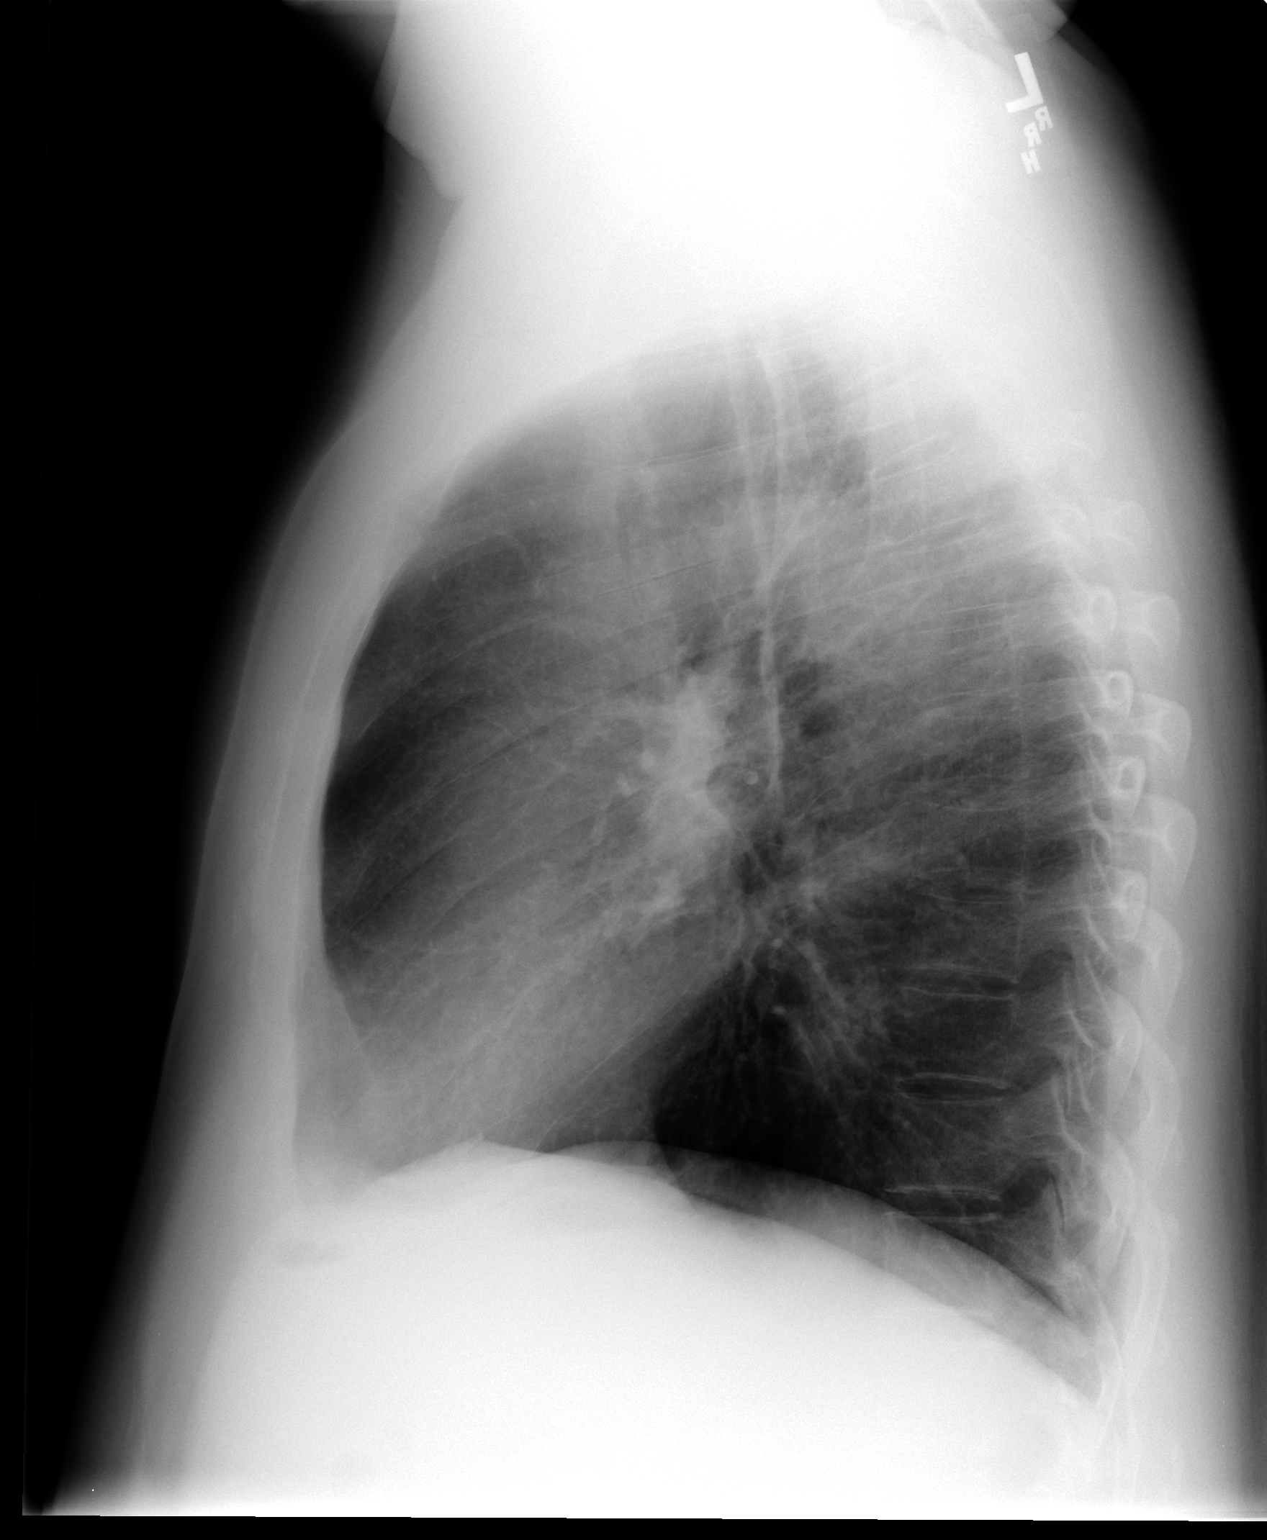

[2 of 2 positions shown; findings below may reference images not displayed]

FINDINGS: Heart size is normal. There is mild aortic calcification. The lungs
are clear with grossly normal volumes. Question some emphysematous
changes in the upper lobes. No infiltrate, mass, effusion or
collapse. No significant bony finding.
IMPRESSION: No active disease.  Question some emphysema in the upper lobes.

## 2018-03-31 ENCOUNTER — Telehealth (HOSPITAL_COMMUNITY): Payer: Self-pay | Admitting: *Deleted

## 2018-03-31 NOTE — Telephone Encounter (Signed)
Referral received from Dr. 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 Pt will complete follow up on 1/28.  Patient also sees providers with the [place].  Will have support staff make initial contact with pt regarding referral process and whether he is using [facility] for Cardiac rehab.  Pt would need to obtain VA authorization if so and complete any follow up. Cherre Huger, BSN Cardiac and Training and development officer

## 2018-04-11 ENCOUNTER — Telehealth (HOSPITAL_COMMUNITY): Payer: Self-pay

## 2018-04-11 NOTE — Telephone Encounter (Signed)
Called patient to see if he is interested in the Cardiac Rehab Program. Patient expressed interest. Explained scheduling process and went over insurance, patient verbalized understanding. Will contact patient for scheduling once f/u has been completed.  Adv pt he will need to contact the [place] to send auth. over, patient verbalized understanding.

## 2018-05-28 ENCOUNTER — Telehealth (HOSPITAL_COMMUNITY): Payer: Self-pay | Admitting: *Deleted

## 2018-05-28 NOTE — Telephone Encounter (Signed)
Pt has completed his follow up with Dr. Duke Salvia.  Pt is progressing well in his recovery.  Awaiting VA authorization, signed MD order, 12 lead ekg for scheduling. Cherre Huger, BSN Cardiac and Training and development officer

## 2019-07-07 ENCOUNTER — Encounter (INDEPENDENT_AMBULATORY_CARE_PROVIDER_SITE_OTHER): Payer: Self-pay | Admitting: *Deleted

## 2019-08-27 ENCOUNTER — Telehealth: Payer: Self-pay | Admitting: Gastroenterology

## 2019-08-27 NOTE — Telephone Encounter (Signed)
Yes. I would be happy to see him. Thanks.

## 2019-08-27 NOTE — Telephone Encounter (Signed)
Dr. Tarri Glenn,  We receivied this referral for EGD and Colon From the [facility] for this pt.  I have has seen GI at the [facility] office I have sent the records for your review. He had a EGD/Colon 10 years ago. Will you accept this pt?

## 2019-09-04 ENCOUNTER — Encounter: Payer: Self-pay | Admitting: Gastroenterology

## 2019-10-20 ENCOUNTER — Encounter: Payer: 59 | Admitting: Gastroenterology

## 2019-10-23 ENCOUNTER — Encounter: Payer: 59 | Admitting: Gastroenterology

## 2020-10-19 NOTE — Telephone Encounter (Signed)
Records shredded since patient did not call to reschedule.
